# Patient Record
Sex: Female | Born: 1985 | Race: White | Hispanic: No | Marital: Married | Smoking: Never smoker
Health system: Southern US, Community
[De-identification: ages and names within clinical notes are randomized; demographics above are authoritative.]

## PROBLEM LIST (undated history)

## (undated) ENCOUNTER — Inpatient Hospital Stay (HOSPITAL_COMMUNITY): Payer: Self-pay

## (undated) DIAGNOSIS — IMO0002 Reserved for concepts with insufficient information to code with codable children: Secondary | ICD-10-CM

## (undated) DIAGNOSIS — L409 Psoriasis, unspecified: Secondary | ICD-10-CM

## (undated) DIAGNOSIS — D649 Anemia, unspecified: Secondary | ICD-10-CM

## (undated) DIAGNOSIS — F419 Anxiety disorder, unspecified: Secondary | ICD-10-CM

## (undated) DIAGNOSIS — J45909 Unspecified asthma, uncomplicated: Secondary | ICD-10-CM

## (undated) DIAGNOSIS — N12 Tubulo-interstitial nephritis, not specified as acute or chronic: Secondary | ICD-10-CM

## (undated) DIAGNOSIS — T7840XA Allergy, unspecified, initial encounter: Secondary | ICD-10-CM

## (undated) HISTORY — DX: Reserved for concepts with insufficient information to code with codable children: IMO0002

## (undated) HISTORY — PX: EYE SURGERY: SHX253

## (undated) HISTORY — DX: Allergy, unspecified, initial encounter: T78.40XA

## (undated) HISTORY — DX: Anxiety disorder, unspecified: F41.9

## (undated) HISTORY — DX: Psoriasis, unspecified: L40.9

---
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
## 2005-10-01 ENCOUNTER — Other Ambulatory Visit: Admission: RE | Admit: 2005-10-01 | Discharge: 2005-10-01 | Payer: Self-pay | Admitting: Family Medicine

## 2007-05-11 ENCOUNTER — Other Ambulatory Visit: Admission: RE | Admit: 2007-05-11 | Discharge: 2007-05-11 | Payer: Self-pay | Admitting: Family Medicine

## 2008-10-11 ENCOUNTER — Other Ambulatory Visit: Admission: RE | Admit: 2008-10-11 | Discharge: 2008-10-11 | Payer: Self-pay | Admitting: Family Medicine

## 2010-07-26 ENCOUNTER — Other Ambulatory Visit (HOSPITAL_COMMUNITY)
Admission: RE | Admit: 2010-07-26 | Discharge: 2010-07-26 | Disposition: A | Discharge status: Home / Self Care | Payer: Self-pay | Source: Ambulatory Visit | Attending: Family Medicine | Admitting: Family Medicine

## 2010-07-26 ENCOUNTER — Other Ambulatory Visit: Payer: Self-pay | Admitting: Family Medicine

## 2010-07-26 DIAGNOSIS — Z124 Encounter for screening for malignant neoplasm of cervix: Secondary | ICD-10-CM | POA: Insufficient documentation

## 2012-05-26 NOTE — L&D Delivery Note (Signed)
Cesarean Section Procedure Note  Indications: Low lying placenta, vaginal bleeding  Pre-operative Diagnosis: 36 week 2 day pregnancy.  Post-operative Diagnosis: same  Surgeon: Thurnell Lose   Assistants: Dr. Christophe Louis  Anesthesia: Spinal anesthesia  ASA Class: per anesthesia   Procedure Details   The patient was seen in the Holding Room. The risks, benefits, complications, treatment options, and expected outcomes were discussed with the patient.  The patient concurred with the proposed plan, giving informed consent.  The site of surgery properly noted/marked. The patient was taken to Operating Room # 2, identified as Natalie Barker and the procedure verified as C-Section Delivery. A Time Out was held and the above information confirmed.  After induction of anesthesia, the patient was draped and prepped in the usual sterile manner. A Pfannenstiel incision was made and carried down through the subcutaneous tissue to the fascia. Fascial incision was made and extended transversely. The fascia was separated from the underlying rectus tissue superiorly and inferiorly. The peritoneum was identified and entered. Peritoneal incision was extended longitudinally. The utero-vesical peritoneal reflection was incised transversely and the bladder flap was bluntly freed from the lower uterine segment. A low transverse uterine incision was made. Anterior placenta noted after transverse incision made.  Delivered from cephalic presentation was a pending gram Female with Apgar scores of 9 at one minute and 9 at five minutes. After the umbilical cord was clamped and cut cord blood was obtained for evaluation. The placenta was removed intact and appeared normal. The uterine outline, tubes and ovaries appeared normal. The uterine incision was closed with running locked sutures of 0.0  Chromic. Second layer was used for imbrication. Hemostasis was observed. Lavage was carried out until clear. The fascia was  then reapproximated with running sutures of 0.0 Vicryl. Subcutaneous space was reapproximated with 2.0 Plain gut.The skin was reapproximated with Vicryl 4.0 on a Keith needle.  Instrument, sponge, and needle counts were correct prior the abdominal closure and at the conclusion of the case.   Findings: Viable female infant, vertex.  Anterior placenta.  Clear fluid.  No adherance of placenta.  1 cm solid lesion in right mesosalpinx proximal to fibriae.  Estimated Blood Loss:  1000         Drains: Foley         Total IV Fluids:  Per anesthesia ml         Specimens: Placenta          Implants: Interceed         Complications:  None; patient tolerated the procedure well.         Disposition: PACU - hemodynamically stable.         Condition: stable  Attending Attestation: I was present and scrubbed for the entire procedure.

## 2012-08-31 ENCOUNTER — Other Ambulatory Visit (HOSPITAL_COMMUNITY)
Admission: RE | Admit: 2012-08-31 | Discharge: 2012-08-31 | Disposition: A | Discharge status: Home / Self Care | Payer: BC Managed Care – PPO | Source: Ambulatory Visit | Attending: Obstetrics and Gynecology | Admitting: Obstetrics and Gynecology

## 2012-08-31 ENCOUNTER — Other Ambulatory Visit: Payer: Self-pay | Admitting: Obstetrics and Gynecology

## 2012-08-31 DIAGNOSIS — Z01419 Encounter for gynecological examination (general) (routine) without abnormal findings: Secondary | ICD-10-CM | POA: Insufficient documentation

## 2013-01-12 ENCOUNTER — Other Ambulatory Visit: Payer: Self-pay | Admitting: Obstetrics and Gynecology

## 2013-01-12 DIAGNOSIS — O368190 Decreased fetal movements, unspecified trimester, not applicable or unspecified: Secondary | ICD-10-CM

## 2013-01-13 ENCOUNTER — Ambulatory Visit (HOSPITAL_COMMUNITY): Payer: BC Managed Care – PPO

## 2013-01-21 ENCOUNTER — Inpatient Hospital Stay (HOSPITAL_COMMUNITY): Payer: BC Managed Care – PPO

## 2013-01-21 ENCOUNTER — Inpatient Hospital Stay (HOSPITAL_COMMUNITY)
Admission: AD | Admit: 2013-01-21 | Discharge: 2013-01-21 | Disposition: A | Discharge status: Home / Self Care | Payer: BC Managed Care – PPO | Source: Ambulatory Visit | Attending: Obstetrics and Gynecology | Admitting: Obstetrics and Gynecology

## 2013-01-21 ENCOUNTER — Encounter (HOSPITAL_COMMUNITY): Payer: Self-pay | Admitting: *Deleted

## 2013-01-21 DIAGNOSIS — O26859 Spotting complicating pregnancy, unspecified trimester: Secondary | ICD-10-CM | POA: Insufficient documentation

## 2013-01-21 DIAGNOSIS — Z91013 Allergy to seafood: Secondary | ICD-10-CM

## 2013-01-21 DIAGNOSIS — O4700 False labor before 37 completed weeks of gestation, unspecified trimester: Secondary | ICD-10-CM | POA: Insufficient documentation

## 2013-01-21 DIAGNOSIS — Z880 Allergy status to penicillin: Secondary | ICD-10-CM

## 2013-01-21 DIAGNOSIS — O4440 Low lying placenta NOS or without hemorrhage, unspecified trimester: Secondary | ICD-10-CM

## 2013-01-21 HISTORY — DX: Tubulo-interstitial nephritis, not specified as acute or chronic: N12

## 2013-01-21 HISTORY — DX: Unspecified asthma, uncomplicated: J45.909

## 2013-01-21 HISTORY — DX: Anemia, unspecified: D64.9

## 2013-01-21 LAB — URINE MICROSCOPIC-ADD ON

## 2013-01-21 LAB — URINALYSIS, ROUTINE W REFLEX MICROSCOPIC
Glucose, UA: NEGATIVE mg/dL
Ketones, ur: NEGATIVE mg/dL
Leukocytes, UA: NEGATIVE
Protein, ur: NEGATIVE mg/dL

## 2013-01-21 LAB — CBC
Hemoglobin: 10.6 g/dL — ABNORMAL LOW (ref 12.0–15.0)
MCH: 28.3 pg (ref 26.0–34.0)
MCHC: 33.5 g/dL (ref 30.0–36.0)

## 2013-01-21 MED ORDER — NIFEDIPINE 10 MG PO CAPS: 10.0000 mg | ORAL_CAPSULE | Freq: Four times a day (QID) | ORAL | Status: DC | PRN

## 2013-01-21 MED ORDER — NIFEDIPINE 10 MG PO CAPS
10.0000 mg | ORAL_CAPSULE | Freq: Four times a day (QID) | ORAL | Status: DC
  Administered 2013-01-21: 10 mg via ORAL
  Filled 2013-01-21: qty 1

## 2013-01-21 NOTE — MAU Note (Addendum)
Started bleeding this afternoon.  Small amt of pani rt mid/lat back and RLQ,  Started before bleeding. Known previa.  Scant amt of blood when used restroom now.  Complete previa, has never had bleeding with preg, this is first.

## 2013-01-21 NOTE — MAU Provider Note (Signed)
Returned from [name].  No bleeding, reports crampy pain on right side that has been present all afternoon.  Denies N/V, has had no further bleeding.  [name] results:   Cervix 4.1 cm, posterior placenta, low-lying, 0.5 cm from os. EFW 5+1, 80%ile, AFI 17.92, 66%ile. Vtx.  Results for orders placed during the hospital encounter of 01/21/13 (from the past 24 hour(s))  URINALYSIS, ROUTINE W REFLEX MICROSCOPIC     Status: Abnormal   Collection Time    01/21/13  6:00 PM      Result Value Range   Color, Urine YELLOW  YELLOW   APPearance CLEAR  CLEAR   Specific Gravity, Urine 1.025  1.005 - 1.030   pH 6.0  5.0 - 8.0   Glucose, UA NEGATIVE  NEGATIVE mg/dL   Hgb urine dipstick MODERATE (*) NEGATIVE   Bilirubin Urine NEGATIVE  NEGATIVE   Ketones, ur NEGATIVE  NEGATIVE mg/dL   Protein, ur NEGATIVE  NEGATIVE mg/dL   Urobilinogen, UA 0.2  0.0 - 1.0 mg/dL   Nitrite NEGATIVE  NEGATIVE   Leukocytes, UA NEGATIVE  NEGATIVE  URINE MICROSCOPIC-ADD ON     Status: Abnormal   Collection Time    01/21/13  6:00 PM      Result Value Range   Squamous Epithelial / LPF FEW (*) RARE   WBC, UA 0-2  <3 WBC/hpf   RBC / HPF 3-6  <3 RBC/hpf   Bacteria, UA FEW (*) RARE   Urine-Other MUCOUS PRESENT    CBC     Status: Abnormal   Collection Time    01/21/13  6:40 PM      Result Value Range   WBC 15.4 (*) 4.0 - 10.5 K/uL   RBC 3.74 (*) 3.87 - 5.11 MIL/uL   Hemoglobin 10.6 (*) 12.0 - 15.0 g/dL   HCT 31.6 (*) 36.0 - 46.0 %   MCV 84.5  78.0 - 100.0 fL   MCH 28.3  26.0 - 34.0 pg   MCHC 33.5  30.0 - 36.0 g/dL   RDW 13.5  11.5 - 15.5 %   Platelets 252  150 - 400 K/uL  TYPE AND SCREEN     Status: None   Collection Time    01/21/13  6:40 PM      Result Value Range   ABO/RH(D) O POS     Antibody Screen NEG     Sample Expiration 01/24/2013     No bleeding.  FHR Category 1 UCs--irritability noted at times.  Plan: Consulted with Dr. Charlesetta Garibaldi. Will give Procardia 10 mg po now, and anticipate d/c'ing home on 10 mg po q  6 hours prn. Will defer betamethasone due to resolution of previa. Will d/c home on BR for the weekend and pelvic rest.  Donnel Saxon, CNM 01/21/13 8:30p  Addendum: Less irritability now. FHR Category 1 VSS.  D/C'd home with PTL precautions. F/U with Dr. Landry Mellow as scheduled on Tuesday or prn.  Donnel Saxon, CNM 01/21/13 11p

## 2013-01-21 NOTE — H&P (Signed)
Natalie Barker is a 27 y.o. female who receives prenatal with Dr. Thurnell Lose with [facility]  presenting  At 32 wks and 4 days based on 8 wk ultrasound  with EDD 03/15/2013. Pregnancy has been complicated by marginal previa on ultrasound 12/27/2012 in the office.  Patient reports dark brown spotting that occurred this afternoon. She noticed this with wiping. She denies sexual activity. No lof +FM no contractions.  History OB History   Grav Para Term Preterm Abortions TAB SAB Ect Mult Living   1              Past Medical History  Diagnosis Date  . Anemia   . Asthma   . Pyelonephritis     in grade school   Past Surgical History  Procedure Laterality Date  . Eye surgery      contacts surgically implanted   Family History: family history includes ALS in her maternal uncle; Asthma in her mother; COPD in her maternal grandmother; Cancer in her mother; Diabetes in her maternal grandmother; Heart disease in her father and maternal grandmother; Hypertension in her father and maternal grandmother; Stroke in her maternal grandmother. Social History:  reports that she has never smoked. She has never used smokeless tobacco. She reports that she does not drink alcohol or use illicit drugs.   Prenatal Transfer Tool  Maternal Diabetes: No Genetic Screening: Declined Maternal Ultrasounds/Referrals: Marginal previa on 12/27/2012 Fetal Ultrasounds or other Referrals:  None Maternal Substance Abuse:  No Significant Maternal Medications:  None Significant Maternal Lab Results:  None Other Comments:  None  Review of Systems  All other systems reviewed and are negative.      Blood pressure 124/68, pulse 98, temperature 98.3 F (36.8 C), temperature source Oral, resp. rate 18, height 5' 9"  (1.753 m), weight 89.812 kg (198 lb). Maternal Exam:  Introitus: Normal vulva. Vulva is negative for lesion.    Fetal Exam Fetal Monitor Review: Mode: fetoscope.   Baseline rate: 140.  Variability:  moderate (6-25 bpm).   Pattern: accelerations present.    Fetal State Assessment: Category I - tracings are normal.     Physical Exam  Constitutional: She is oriented to person, place, and time. She appears well-developed and well-nourished.  Respiratory: Effort normal.  GI: Soft. There is no tenderness.  Genitourinary: Vulva exhibits no lesion.  No blood on perineum  ..vaginal exam deferred due to history of previa   Musculoskeletal: Normal range of motion. She exhibits no edema.  Neurological: She is alert and oriented to person, place, and time.  Skin: Skin is warm and dry.    Prenatal labs: ABO, Rh:   O positive  Antibody:  Negative  Rubella:  Immune  RPR:   Non reactive  HBsAg:  Negative   HIV:   Non reactive  GBS:   unknown  Assessment/Plan: 32 wks and 4 days with h/O marginal previa with spontaneous spotting earlier. No active bleeding currently  Will order u/s for evaluation of placenta .Marland Kitchen If continued previa admit for betamethasone and bed rest. If no further bleeding after steroids d/c home on bedrest.  If no previa on ultrasound pt may be discharged home with follow up in the office   plan of care discussed with patient and her husband.  She is informed of [employer] coverage this evening and over the weekend .  Will discuss plan of care with  Dr. Charlesetta Garibaldi at 7pm this evening    Katya Rolston J. 01/21/2013, 6:39 PM

## 2013-01-25 ENCOUNTER — Other Ambulatory Visit: Payer: Self-pay | Admitting: Obstetrics and Gynecology

## 2013-01-25 ENCOUNTER — Inpatient Hospital Stay (HOSPITAL_COMMUNITY)
Admission: AD | Admit: 2013-01-25 | Discharge: 2013-01-25 | Disposition: A | Discharge status: Home / Self Care | Payer: BC Managed Care – PPO | Source: Ambulatory Visit | Attending: Obstetrics and Gynecology | Admitting: Obstetrics and Gynecology

## 2013-01-25 DIAGNOSIS — O4700 False labor before 37 completed weeks of gestation, unspecified trimester: Secondary | ICD-10-CM | POA: Insufficient documentation

## 2013-01-25 MED ORDER — BETAMETHASONE SOD PHOS & ACET 6 (3-3) MG/ML IJ SUSP
12.0000 mg | INTRAMUSCULAR | Status: DC
Start: 2013-01-25 — End: 2013-01-25
  Administered 2013-01-25: 12 mg via INTRAMUSCULAR
  Filled 2013-01-25: qty 2

## 2013-01-26 ENCOUNTER — Inpatient Hospital Stay (HOSPITAL_COMMUNITY)
Admission: AD | Admit: 2013-01-26 | Discharge: 2013-01-26 | Disposition: A | Discharge status: Home / Self Care | Payer: BC Managed Care – PPO | Source: Ambulatory Visit | Attending: Obstetrics and Gynecology | Admitting: Obstetrics and Gynecology

## 2013-01-26 DIAGNOSIS — O4700 False labor before 37 completed weeks of gestation, unspecified trimester: Secondary | ICD-10-CM | POA: Insufficient documentation

## 2013-01-26 MED ORDER — BETAMETHASONE SOD PHOS & ACET 6 (3-3) MG/ML IJ SUSP
12.0000 mg | Freq: Once | INTRAMUSCULAR | Status: AC
  Administered 2013-01-26: 12 mg via INTRAMUSCULAR
  Filled 2013-01-26: qty 2

## 2013-01-26 NOTE — MAU Note (Signed)
Here for 2nd Betamethasone injection

## 2013-02-03 ENCOUNTER — Encounter (HOSPITAL_COMMUNITY): Payer: Self-pay | Admitting: *Deleted

## 2013-02-03 ENCOUNTER — Inpatient Hospital Stay (HOSPITAL_COMMUNITY)
Admission: AD | Admit: 2013-02-03 | Discharge: 2013-02-19 | DRG: 650 | Disposition: A | Discharge status: Home / Self Care | Payer: BC Managed Care – PPO | Source: Ambulatory Visit | Attending: Obstetrics and Gynecology | Admitting: Obstetrics and Gynecology

## 2013-02-03 DIAGNOSIS — O4410 Placenta previa with hemorrhage, unspecified trimester: Principal | ICD-10-CM | POA: Diagnosis present

## 2013-02-03 DIAGNOSIS — D649 Anemia, unspecified: Secondary | ICD-10-CM | POA: Diagnosis present

## 2013-02-03 DIAGNOSIS — O9902 Anemia complicating childbirth: Secondary | ICD-10-CM | POA: Diagnosis present

## 2013-02-03 LAB — URINALYSIS, ROUTINE W REFLEX MICROSCOPIC
Bilirubin Urine: NEGATIVE
Hgb urine dipstick: NEGATIVE
Ketones, ur: NEGATIVE mg/dL
Protein, ur: NEGATIVE mg/dL
Urobilinogen, UA: 0.2 mg/dL (ref 0.0–1.0)

## 2013-02-03 LAB — CBC
MCH: 28.1 pg (ref 26.0–34.0)
MCHC: 33.5 g/dL (ref 30.0–36.0)
Platelets: 261 10*3/uL (ref 150–400)
RDW: 13.6 % (ref 11.5–15.5)

## 2013-02-03 LAB — TYPE AND SCREEN: ABO/RH(D): O POS

## 2013-02-03 MED ORDER — LACTATED RINGERS IV SOLN
INTRAVENOUS | Status: DC
  Administered 2013-02-03: via INTRAVENOUS

## 2013-02-03 MED ORDER — DOCUSATE SODIUM 100 MG PO CAPS
100.0000 mg | ORAL_CAPSULE | Freq: Every day | ORAL | Status: DC
  Administered 2013-02-04 – 2013-02-19 (×14): 100 mg via ORAL
  Filled 2013-02-03 (×16): qty 1

## 2013-02-03 MED ORDER — CALCIUM CARBONATE ANTACID 500 MG PO CHEW: 2.0000 | CHEWABLE_TABLET | ORAL | Status: DC | PRN

## 2013-02-03 MED ORDER — PRENATAL MULTIVITAMIN CH
1.0000 | ORAL_TABLET | Freq: Every day | ORAL | Status: DC
  Administered 2013-02-04 – 2013-02-19 (×16): 1 via ORAL
  Filled 2013-02-03 (×16): qty 1

## 2013-02-03 MED ORDER — LACTATED RINGERS IV BOLUS (SEPSIS)
500.0000 mL | Freq: Once | INTRAVENOUS | Status: AC
  Administered 2013-02-03: 20:00:00 via INTRAVENOUS

## 2013-02-03 MED ORDER — ACETAMINOPHEN 325 MG PO TABS
650.0000 mg | ORAL_TABLET | ORAL | Status: DC | PRN
  Administered 2013-02-05 – 2013-02-11 (×2): 650 mg via ORAL
  Administered 2013-02-11: 325 mg via ORAL
  Filled 2013-02-03 (×2): qty 2
  Filled 2013-02-03: qty 1

## 2013-02-03 MED ORDER — ZOLPIDEM TARTRATE 5 MG PO TABS
5.0000 mg | ORAL_TABLET | Freq: Every evening | ORAL | Status: DC | PRN
  Administered 2013-02-03 – 2013-02-13 (×8): 5 mg via ORAL
  Filled 2013-02-03 (×8): qty 1

## 2013-02-03 NOTE — H&P (Addendum)
Natalie Barker is a 27 y.o. female G1 at 69 2/7 weeks consistent with an 8 weeks ultrasound with a marginal previa presenting for c/o vaginal bleeding.  Pt was sitting at her desk when she felt the urge to urinate then felt something pass.  When she went to the bathroom, she saw it a dime sized clot when she wiped.  She saw minimal blood as she continued wiping.  Currently she has no blood on her pantiliner.  Pt was seen yesterday in the office for a routine ob visit.  Due to c/o of vaginal discharge/leakiing, lower 1/3 of vagina was examined to collect discharge. Pt was not clinically ruptured and no ferning seen.  Pt with c/o mild intermittent cramping.  Pt has a h/o scant brown spotting at 32 4/7 weeks.  She was evaluated in MAU and was noted to have mild irregular contractions which resolved with Procardia.  A preliminary report showed she had a low lying placenta so s she was discharged.  The final report stated she had a marginal previa, 0.5 cm from the os, which was read by the MFM.  Pt was given a course of BMZ last week at 33 weeks due to this finding.  Pt has not had bleeding since this time until today.  PNC has been o/w uncomplicated.  Complete placenta previa diagnosed at 20 weeks at time of anatomy ultrasound.  Pt also has a hemorrhoid but denies rectal bleeding.  Maternal Medical History:  Reason for admission: Vaginal bleeding.   Contractions: Onset was 1-2 hours ago.    Fetal activity: Perceived fetal activity is normal.    Prenatal complications: Placental abnormality.   Prenatal Complications - Diabetes: none.    OB History   Grav Para Term Preterm Abortions TAB SAB Ect Mult Living   1              Past Medical History  Diagnosis Date  . Anemia   . Asthma   . Pyelonephritis     in grade school   Past Surgical History  Procedure Laterality Date  . Eye surgery      contacts surgically implanted   Family History: family history includes ALS in her maternal  uncle; Asthma in her mother; COPD in her maternal grandmother; Cancer in her mother; Diabetes in her maternal grandmother; Heart disease in her father and maternal grandmother; Hypertension in her father and maternal grandmother; Stroke in her maternal grandmother. Social History:  reports that she has never smoked. She has never used smokeless tobacco. She reports that she does not drink alcohol or use illicit drugs.   Review of Systems  Constitutional: Negative for fever and chills.  Respiratory: Positive for shortness of breath.   Gastrointestinal: Positive for vomiting and abdominal pain.  Neurological: Negative for headaches.      Blood pressure 129/85, pulse 107, temperature 98.4 F (36.9 C), temperature source Oral, resp. rate 16, weight 88.814 kg (195 lb 12.8 oz), SpO2 100.00%. Maternal Exam:  Uterine Assessment: Contraction frequency is irregular.   Abdomen: Fetal presentation: vertex  Introitus: Normal vulva. Vulva is negative for condylomata and lesion.  No bleeding seen at introitus.  Q tip for Amnisure was not blood stained.  Cervix: not evaluated.   Fetal Exam Fetal Monitor Review: Mode: fetoscope.   Baseline rate: Reactive, no decels.  Variability: moderate (6-25 bpm).   Pattern: no decelerations.    Fetal State Assessment: Category I - tracings are normal.     Physical Exam  Constitutional: She is oriented to person, place, and time. She appears well-developed. No distress.  HENT:  Head: Normocephalic and atraumatic.  Eyes: EOM are normal.  Neck: Normal range of motion.  Cardiovascular: Normal rate, regular rhythm and normal heart sounds.   Respiratory: Effort normal and breath sounds normal.  GI: There is no tenderness.  Genitourinary: Vulva exhibits no lesion.  Musculoskeletal: She exhibits edema.  Trace edema.  Neurological: She is alert and oriented to person, place, and time.  Skin: Skin is warm and dry. She is not diaphoretic.  Psychiatric: She  has a normal mood and affect.    Prenatal labs: ABO, Rh: --/--/O POS, O POS (08/29 1840) Antibody: NEG (08/29 1840) Rubella:   RPR:    HBsAg:    HIV:    GBS:     Assessment/Plan: IUP at 34 2/7 weeks, Marginal previa. S/p BMZ.   Last ultrasound @ 32 3/7 weeks. Marginal previa. Presumed vaginal bleeding.  Bleeding origin is vaginal until proven otherwise.  R/o UTI with UA and culture. Pt has only been on monitor x 30 minutes but regular contractions have not been seen. + Irritability.  IVF bolus of LR then continue 125 ml/hr. Amnisure to confirm intact membranes. GBS collected with sensitivities in the event of ROM or labor. Continuous monitoring. NPO x 4 hours in the event bleeding resumes.   MFM consult in am to assess placental location and provide recommendations.   Unsure if this would be considered the sentinel or second bleed due to the scant amount of brown discharge previously.  If this is considered the second bleed, pt counseled it may be advised she continue inpatient bedrest. If ROM detected with Amnisure and marginal previa still present, pt counseled she c-section is recommended b/c it is not recommended to continue the pregnancy past 34 weeks with ROM.  R/B of c-section d/w pt and family at bedside. All questions answered. Pt informed that Dr. Raphael Gibney is the on-call MD.  Verbal check out done prior to the return of labs or substantial  monitoring   Bronwyn Belasco 02/03/2013, 7:38 PM

## 2013-02-03 NOTE — MAU Note (Signed)
Patient states she has a marginal previa. States she had spotting a couple of days ago, today was heavier and brown/red with a dime size blood clot. Having abdominal pressure. Has had fetal movement but not as much as usual today.

## 2013-02-04 ENCOUNTER — Inpatient Hospital Stay (HOSPITAL_COMMUNITY): Payer: BC Managed Care – PPO

## 2013-02-04 ENCOUNTER — Ambulatory Visit (HOSPITAL_COMMUNITY): Payer: BC Managed Care – PPO

## 2013-02-04 LAB — URINALYSIS, ROUTINE W REFLEX MICROSCOPIC
Bilirubin Urine: NEGATIVE
Leukocytes, UA: NEGATIVE
Nitrite: NEGATIVE
Specific Gravity, Urine: 1.015 (ref 1.005–1.030)
Urobilinogen, UA: 0.2 mg/dL (ref 0.0–1.0)

## 2013-02-04 LAB — URINE CULTURE: Colony Count: 7000

## 2013-02-04 LAB — URINE MICROSCOPIC-ADD ON

## 2013-02-04 MED ORDER — SODIUM CHLORIDE 0.9 % IJ SOLN
3.0000 mL | Freq: Two times a day (BID) | INTRAMUSCULAR | Status: DC
  Administered 2013-02-04 – 2013-02-08 (×6): 3 mL via INTRAVENOUS

## 2013-02-04 NOTE — Progress Notes (Signed)
MATERNAL FETAL MEDICINE CONSULT  Patient Name: Natalie Barker Medical Record Number:  [id number] Date of Birth: 1986-02-08 Requesting Physician Name:  Thurnell Lose, MD Date of Service: 02/04/2013  Chief Complaint Low-lying placenta with vaginal bleeding  History of Present Illness Keashia Haskins was seen secondary to vaginal bleeding in the setting of a known low-lying placenta at the request of Thurnell Lose, MD.  The patient is a 27 y.o. G1P0,at 62w3dwith an EDD of 03/15/2013, Alternate EDD Entry dating method.  She had some brown spotting several weeks ago that prompted an MAU evaluation.  She had an ultrasound at that demonstrated a low-lying placenta/marginal placenta previa.  The spotting resolved and she was dismissed home.  Yesterday she experienced an episode of vaginal bleeding that prompted the present admission.  [facility] 0.5 cm from the internal cervical os.  She reports her bleeding has not recurred.  Review of Systems Pertinent items are noted in HPI.  Patient History OB History  Gravida Para Term Preterm AB SAB TAB Ectopic Multiple Living  1             # Outcome Date GA Lbr Len/2nd Weight Sex Delivery Anes PTL Lv  1 CUR               Past Medical History  Diagnosis Date  . Anemia   . Asthma   . Pyelonephritis     in grade school    Past Surgical History  Procedure Laterality Date  . Eye surgery      contacts surgically implanted    History   Social History  . Marital Status: Married    Spouse Name: N/A    Number of Children: N/A  . Years of Education: N/A   Social History Main Topics  . Smoking status: Never Smoker   . Smokeless tobacco: Never Used  . Alcohol Use: No  . Drug Use: No  . Sexual Activity: Not Currently   Other Topics Concern  . None   Social History Narrative  . None    Family History  Problem Relation Age of Onset  . Cancer Mother      thyroid, colon, breast  . Asthma Mother   . Hypertension Father   . Heart disease Father     heart attack, pacemaker  . Diabetes Maternal Grandmother   . Heart disease Maternal Grandmother   . COPD Maternal Grandmother   . Stroke Maternal Grandmother   . Hypertension Maternal Grandmother   . ALS Maternal Uncle    In addition, the patient has no family history of mental retardation, birth defects, or genetic diseases.  Physical Examination Filed Vitals:   02/04/13 0800  BP: 97/51  Pulse: 82  Temp: 98.9 F (37.2 C)  Resp:    General appearance - alert, well appearing, and in no distress  Assessment and Recommendations 1.  Low-lying placenta.  It is very likely that the bleeding Ms. CBuleyexperienced was related to the low-lying placenta.  Given the vague history of brown spotting she reported prior to this episode, I would consider this her first bleeding event.  I would continue inpatient observation through the weekend, but if she has not further bleeding she can be dismissed and followed as an outpatient.  The placenta is to close to the cervix to safely allow for a vaginal delivery at this time.  Although further placental migration away from the os  is unlikely, I recommend performing an additional transvaginal ultrasound in 2 weeks.  If the placenta is 2 or more cm away from the cervix pregnancy can be allowed to continue and a vaginal delivery attempted.  If the placenta is less than 2 cm from the os Ms. Ports should be delivered via LTCS between 36-37 weeks.  I spent 15 minutes with Ms. Thoennes today of which 50% was face-to-face counseling.  Thank you for referring Ms. Allis to the [facility].  Please do not hesitate to contact us with questions.   Jolyn Lent, MD

## 2013-02-04 NOTE — Progress Notes (Signed)
Patient ID: Natalie Barker, female   DOB: 06-05-1985, 27 y.o.   MRN: [id number]  Pt without complaints.  No bleeding overnight.  S/p MFM consult this am. AFVSS Gen:  NAD, comfortable. Abdomen:  No fundal tenderness Ext:  No edema, nontenderness.  Ultrasound per MFM- Low-lying placenta, 0.5 cm from the os. Reviewed consult note and spoke with Dr. Nelda Severe. Hg 10.9  A/P:  IUP at 34 3/7 weeks, Low-lying placenta, s/p BMZ with vaginal bleeding.  Currently not bleeding. Appreciate MFM consult. Observe for the next 72 hours.  Bleeding yesterday will be pt's first episode of bleeding due to the vague brown spotting at 32 weeks. If no bleeding, discharge home on bedrest. Repeat ultrasound in 2 weeks to assess placental location.  If 2 cm away from os, pt candidate for a vaginal delivery.  If not, proceed with cesarean section at 37 weeks or if bleeding. Plan d/w pt and husband.  All questions answered.

## 2013-02-05 LAB — CBC
HCT: 33.6 % — ABNORMAL LOW (ref 36.0–46.0)
Hemoglobin: 10.9 g/dL — ABNORMAL LOW (ref 12.0–15.0)
MCH: 27.7 pg (ref 26.0–34.0)
MCHC: 32.4 g/dL (ref 30.0–36.0)
RDW: 13.7 % (ref 11.5–15.5)

## 2013-02-05 MED ORDER — NIFEDIPINE 10 MG PO CAPS
10.0000 mg | ORAL_CAPSULE | Freq: Four times a day (QID) | ORAL | Status: DC
  Administered 2013-02-05 – 2013-02-06 (×3): 10 mg via ORAL
  Filled 2013-02-05 (×3): qty 1

## 2013-02-05 MED ORDER — NIFEDIPINE 10 MG PO CAPS
10.0000 mg | ORAL_CAPSULE | Freq: Once | ORAL | Status: AC
  Administered 2013-02-05: 10 mg via ORAL

## 2013-02-05 MED ORDER — NIFEDIPINE 10 MG PO CAPS
ORAL_CAPSULE | ORAL | Status: AC
  Administered 2013-02-05: 10 mg via ORAL
  Filled 2013-02-05: qty 1

## 2013-02-05 MED ORDER — LACTATED RINGERS IV SOLN
INTRAVENOUS | Status: DC
  Administered 2013-02-05 – 2013-02-06 (×3): via INTRAVENOUS

## 2013-02-05 NOTE — Progress Notes (Signed)
No bleeding noted

## 2013-02-05 NOTE — Progress Notes (Signed)
Small amount of yellow mucous noted on pad. No bleeding

## 2013-02-05 NOTE — Progress Notes (Signed)
Pt reports two bright red clots while in shower

## 2013-02-05 NOTE — Progress Notes (Signed)
No bleeding  Noted on pad or perineum

## 2013-02-05 NOTE — Progress Notes (Signed)
No bleeding noted on pad or perineum

## 2013-02-06 LAB — TYPE AND SCREEN: ABO/RH(D): O POS

## 2013-02-06 LAB — CULTURE, BETA STREP (GROUP B ONLY)

## 2013-02-06 LAB — OB RESULTS CONSOLE GBS: GBS: NEGATIVE

## 2013-02-06 NOTE — Progress Notes (Signed)
Patient ID: Natalie Barker, female   DOB: 10-01-1985, 27 y.o.   MRN: [id number]  Late entry  Called by RN that pt passed 2 small clots in shower. Pt with cramping rated 3/10.  Contractions noted on monitor.  I was in surgery and unavailable to assess pt.  Per RN, pt was not acute.  Order given for IVF, NPO, Procardia, Stat CBC, continuous monitoring.  At time of assessment, pt stated she was comfortable and had no active bleeding. Active fetus.  Denied LOF.  Felt more comfortable s/p Procardia.  AFVSS  Gen:  NAD, Comfortable Abd:  No fundal tenderness Ext:  No calf tenderness  NST:  Reactive Contractions irregular, spaced out after Procardia.  Hg 10.9 on admission, now 10.9.  A/P:   IUP at 34 4/7 weeks, Low-lying placenta, S/p BMZ. Intemittent vaginal bleeding. Pt denies significant bleeding but very small clots.  Fetal monitoring Cat 1. Continue plan for now.  Do not recommend tocolysis long term. If bleeding increases or fetal distress, proceed with primary LTCS. Plan d/w pt at length. Dr. Charlesetta Garibaldi to cover call until 3pm.  Will update her on pt.  Pt and husband aware.

## 2013-02-06 NOTE — Progress Notes (Signed)
Patient stated that she is feeling better about her plan of care and that Dr. [name] answered her questions. She states "I just wish I knew when I would deliver". Patient concerned about work, stating she only has 12 weeks of leave or her position will become available to someone else. She explained that this is a factor in her anxiety presently.

## 2013-02-06 NOTE — Progress Notes (Signed)
Lab addendum  GBS negative Urine culture Negative UTI.

## 2013-02-06 NOTE — Progress Notes (Signed)
HD #4  Subjective:  Pt without complaints.  Passed a small bright red clot in the shower this am.  Denies contractions or LOF.  Denies active bleeding.  Active fetal movement.  Objective: Vital signs in last 24 hours: Temp:  [98.5 F (36.9 C)-98.9 F (37.2 C)] 98.5 F (36.9 C) (09/14 1231) Pulse Rate:  [79-111] 97 (09/14 1231) Resp:  [16-20] 16 (09/14 1231) BP: (101-121)/(52-68) 121/57 mmHg (09/14 1231)  Physical Exam:  General: alert, cooperative and no distress Uterine Fundus: Nontender DVT Evaluation: No evidence of DVT seen on physical exam. TED hose on.  EFM:  140s, Reactive, no decels, occasional contraction.  Recent Labs  02/03/13 1935 02/05/13 1030  HGB 10.9* 10.9*  HCT 32.5* 33.6*  WBC 15.1 to 14.  Assessment/Plan: 34 5/7 weeks, low lying placenta.  S/p BMZ.  Admitted for vaginal bleeding.   Currently stable, no active vaginal bleeding.  Hg unchanged from admission.  Type and screen q 3 days. Procardia discontinued this am.  Saline lock IV. Clear liquids for 6 hours to observe if contractions resume and hence bleeding. Proceed with primary LTCS in the event of active bleeding, fetal distress or labor. Plan d/w pt, husband and father at bedside at length.  All questions answered. Plan d/w MFM Dr. Hoy Morn to determine endpoint.  Does not consider small clots, active bleeding and would proceed with cesarean section for the indications above.  Thurnell Lose 02/06/2013, 12:59 PM

## 2013-02-06 NOTE — Progress Notes (Signed)
Today, patient has been frustrated and anxious about her plan of care. Per patient she feels like "the plan of care has not been consistent, and it has changed frequently" I spent time with her to explain a premature delivery and  the nature being uncertain. I explained that the healthcare team is cautious when it comes to a premature delivery as we do not want to delivery until necessary.  She verbalized understanding.  After we had multiple conversations about this, I encouraged her to talk to Dr. [name] about her concerns. I encouraged her to write down a list of questions that she had and to let me know when she was ready.  At 1540, patient let me know she was ready with her list and I called Dr. [name]. I explained patient's frustrations to MD and then transferred the call into the patient's room so that she could discuss directly with patient.

## 2013-02-06 NOTE — Progress Notes (Signed)
Patient reported a small clot in the shower. 1 inch long, and thin. RN reported to Dr. [name]. MD gave order to discontinue Procardia and monitor closely for contractions and bleeding.

## 2013-02-07 MED ORDER — FERROUS GLUCONATE 324 (38 FE) MG PO TABS
324.0000 mg | ORAL_TABLET | Freq: Every day | ORAL | Status: DC
  Administered 2013-02-08 – 2013-02-15 (×8): 324 mg via ORAL
  Filled 2013-02-07 (×9): qty 1

## 2013-02-07 NOTE — Progress Notes (Signed)
Ur chart review completed.  

## 2013-02-07 NOTE — Progress Notes (Signed)
HD #5  Subjective:  Pt without complaints. Denies LOF, vaginal bleeding.  Active fetus.  Objective: Vital signs in last 24 hours: Temp:  [98.1 F (36.7 C)-99.1 F (37.3 C)] 99.1 F (37.3 C) (09/15 1233) Pulse Rate:  [88-103] 103 (09/15 1233) Resp:  [18] 18 (09/15 1233) BP: (96-114)/(44-69) 106/51 mmHg (09/15 1233)  Physical Exam:  General: alert, cooperative and no distress Uterine Fundus: Nontender DVT Evaluation: No evidence of DVT seen on physical exam. TED hose on.  EFM:  130s-140s, Reactive, no decels, occasional contraction.  Recent Labs  02/05/13 1030  HGB 10.9*  HCT 33.6*  WBC 15.1 to 14.  Assessment/Plan: 34 6/7 weeks, low lying placenta.  S/p BMZ.  Admitted for vaginal bleeding.   Currently stable, no active vaginal bleeding.  Fetal status reassuring, Cat 1.  NST x 1 hour TID. Add Fergon due to mild anemia of pregnancy and to optimize Hemoglobin for surgery. Proceed with primary LTCS in the event of active bleeding, fetal distress or labor. Continue inpatient bedrest. Ultrasound at 36 weeks to reevaluate placenta and assess growth.   Thurnell Lose 02/07/2013, 1:01 PM

## 2013-02-08 NOTE — Progress Notes (Signed)
Pt. Stated she wanted to wait until 12 or 1pm to do her fetal monitoring.

## 2013-02-08 NOTE — Progress Notes (Signed)
HD #6  Subjective:  Pt without complaints. Denies LOF, vaginal bleeding.  Active fetus.  Feels contractions but they are not painful.  Tolerating iron.  Objective: Vital signs in last 24 hours: Temp:  [98.3 F (36.8 C)-99.1 F (37.3 C)] 98.3 F (36.8 C) (09/16 1951) Pulse Rate:  [79-94] 86 (09/16 1951) Resp:  [18] 18 (09/16 1951) BP: (109-117)/(59-68) 117/64 mmHg (09/16 1951)  Physical Exam:  General: alert, cooperative and no distress Uterine Fundus: Nontender DVT Evaluation: No evidence of DVT seen on physical exam. TED hose off.  EFM:  150s, Reactive, no decels, occasional contraction. No results found for this basename: HGB, HCT,  in the last 72 hours  Assessment/Plan: 35 weeks, low lying placenta.  S/p BMZ.  Admitted for vaginal bleeding.   Currently stable. No active vaginal bleeding.  Fetal status reassuring, Cat 1.  NST x 1 hour TID. Anemia.  Fergon daily. Proceed with primary LTCS in the event of active bleeding, fetal distress or labor. Continue inpatient bedrest. Ultrasound at 36 weeks to reevaluate placenta and assess growth.   Natalie Barker 02/08/2013, 9:41 PM

## 2013-02-09 LAB — TYPE AND SCREEN

## 2013-02-09 MED ORDER — DIPHENHYDRAMINE-ZINC ACETATE 2-0.1 % EX CREA
TOPICAL_CREAM | Freq: Four times a day (QID) | CUTANEOUS | Status: DC | PRN
  Administered 2013-02-09: 20:00:00 via TOPICAL
  Administered 2013-02-09: 1 via TOPICAL
  Filled 2013-02-09: qty 28

## 2013-02-09 NOTE — Progress Notes (Signed)
HD #6  Subjective:  Pt without complaints. Denies vaginal bleeding.  Active fetus.  Feels contractions but they are not painful. White discharge that filled a pantiliner but has not continued to leak.  Pain when turning wrist.  Objective: Vital signs in last 24 hours: Temp:  [98.3 F (36.8 C)-99.1 F (37.3 C)] 98.4 F (36.9 C) (09/17 0757) Pulse Rate:  [71-94] 89 (09/17 1104) Resp:  [18-20] 18 (09/17 1104) BP: (102-117)/(58-68) 108/65 mmHg (09/17 1104)  Physical Exam:  General: alert, cooperative and no distress Uterine Fundus: Nontender DVT Evaluation: No evidence of DVT seen on physical exam. Wrist/arm not erythematous, TED hose off.  EFM:  10s, Reactive, no decels, cxt x 1  No results found for this basename: HGB, HCT,  in the last 72 hours  Assessment/Plan: 35 weeks, low lying placenta.  S/p BMZ.  Admitted for vaginal bleeding.   Currently stable. No active vaginal bleeding.  D/c IV. Fetal status reassuring, Cat 1.  NST x 1 hour TID. Anemia.  Fergon daily. Proceed with primary LTCS in the event of active bleeding, fetal distress or labor. Continue inpatient bedrest.  Pt lives 45 minutes away. Ultrasound at 36 weeks to reevaluate placenta and assess growth.   Natalie Barker 02/09/2013, 2:10 PM

## 2013-02-09 NOTE — Progress Notes (Signed)
02/09/13 1500  Clinical Encounter Type  Visited With Patient and family together (mom, close friend Natalie Barker)  Visit Type Initial;Spiritual support;Social support  Spiritual Encounters  Spiritual Needs Emotional   Joined ante staff in singing "Happy Birthday" to Natalie Barker and made introductory visit.  Her mom helped point out occasions when it might be especially nice to reach out for chaplain support, and Natalie Barker was receptive.  She reports good support and is feeling calm and happy as her pregnancy continues to progress.  Provided pastoral listening and encouragement.  Please page as further needs arise.  [place], [place]

## 2013-02-10 MED ORDER — DIPHENHYDRAMINE HCL 25 MG PO CAPS
25.0000 mg | ORAL_CAPSULE | ORAL | Status: DC | PRN
  Administered 2013-02-10: 25 mg via ORAL
  Filled 2013-02-10 (×3): qty 1

## 2013-02-10 NOTE — Progress Notes (Signed)
UR completed 

## 2013-02-10 NOTE — Progress Notes (Signed)
HD #7  Subjective:  Pt without complaints. Denies vaginal bleeding.  Active fetus.   Had itching from site of monitors on abdomen.  Slightly improved with topical benadryl.  Had vomiting this am.  Denies reflux symptoms.  Not sleeping overnight despite Ambien.  Objective: Vital signs in last 24 hours: Temp:  [98.1 F (36.7 C)-98.5 F (36.9 C)] 98.2 F (36.8 C) (09/18 1201) Pulse Rate:  [75-101] 86 (09/18 1201) Resp:  [18-20] 18 (09/18 1201) BP: (95-123)/(48-78) 122/69 mmHg (09/18 1201)  Physical Exam:  General: alert, cooperative and no distress Uterine Fundus: Nontender Abdomen:  Erythematous from fetascope.  No rash or lesions. DVT Evaluation: No evidence of DVT seen on physical exam. TED hose off.  EFM:  10s, Reactive, no decels, cxt x 1  No results found for this basename: HGB, HCT,  in the last 72 hours  Assessment/Plan: 35 2/7 weeks, low lying placenta (0.5 cm from os).  S/p BMZ.  Admitted for vaginal bleeding.   Currently stable. No active vaginal bleeding.  Fetal status reassuring, Cat 1.  NST x 1 hour TID. Anemia.  Fergon daily. Proceed with primary LTCS in the event of active bleeding, fetal distress or labor. Continue inpatient bedrest.  Pt lives 45 minutes away. Ultrasound at 36 weeks to reevaluate placenta and assess growth. Contact dermatitis.  Add benadryl po prn itching.  Thurnell Lose 02/10/2013, 1:43 PM

## 2013-02-10 NOTE — Progress Notes (Signed)
Antenatal Nutrition Assessment:  Currently  35 2/[redacted] weeks gestation, with low lying placenta. Height  69" Weight 192 lbs pre-pregnancy weight 180 lbs.Pre-pregnancy  BMI 26.6  IBW 145 lbs  Total weight gain 12 lbs. Weight gain goals 15-25 lbs.   Estimated needs: 19-2100 kcal/day, 80 grams protein/day, 2.3 liters fluid/day  Antenatal regular diet tolerated well, appetite good. Current diet prescription will provide for increased needs.  No abnormal nutrition related labs  Nutrition Dx: Increased nutrient needs r/t pregnancy and fetal growth requirements aeb [redacted] weeks gestation.  No educational needs assessed at this time.  Weyman Rodney M.Fredderick Severance LDN Neonatal Nutrition Support Specialist Pager [phone] ]

## 2013-02-11 MED ORDER — NIFEDIPINE 10 MG PO CAPS
10.0000 mg | ORAL_CAPSULE | Freq: Four times a day (QID) | ORAL | Status: DC | PRN
  Administered 2013-02-11 – 2013-02-12 (×2): 10 mg via ORAL
  Filled 2013-02-11 (×4): qty 1

## 2013-02-11 NOTE — Progress Notes (Signed)
HD #8    Subjective:  patient denies any current bleeding.. Last episode of bleeding was 9/16 she had 2 small quarter size clots noticed while in the shower. She has a contraction every 10-12 minutes  Objective: Vital signs in last 24 hours: Temp:  [97.9 F (36.6 C)-98.6 F (37 C)] 98.4 F (36.9 C) (09/19 0011) Pulse Rate:  [84-98] 86 (09/19 0011) Resp:  [16-18] 18 (09/19 0011) BP: (95-122)/(50-69) 104/51 mmHg (09/19 0011)    Intake/Output from previous day:   Intake/Output this shift:    General appearance: alert and cooperative GI: gravid nontender  Extremities: extremities normal, atraumatic, no cyanosis or edema  Lab Results:  No results found for this basename: WBC, HGB, HCT, PLT,  in the last 72 hours BMET No results found for this basename: NA, K, CL, CO2, GLUCOSE, BUN, CREATININE, CALCIUM,  in the last 72 hours PT/INR No results found for this basename: LABPROT, INR,  in the last 72 hours ABG No results found for this basename: PHART, PCO2, PO2, HCO3,  in the last 72 hours  Studies/Results: No results found.  Anti-infectives: Anti-infectives   None      Assessment/Plan: 35 3/7 weeks, low lying placenta (0.5 cm from os). S/p BMZ. Admitted for vaginal bleeding.  Currently stable. No active vaginal bleeding.  Fetal status reassuring, Cat 1. NST x 1 hour TID.  Anemia. Fergon daily.  Proceed with primary LTCS in the event of active bleeding, fetal distress or labor.  Continue inpatient bedrest. Pt lives 45 minutes away.  Ultrasound at 36 weeks to reevaluate placenta and assess growth.  Contact dermatitis. Add benadryl po prn itching.  pt may have a wheelchair ride daily as long as no bleeding  Preterm contractions procardia 10 mg   Ziara Thelander J. 02/11/2013

## 2013-02-12 LAB — PREPARE RBC (CROSSMATCH)

## 2013-02-12 MED ORDER — LACTATED RINGERS IV BOLUS (SEPSIS): 1000.0000 mL | Freq: Once | INTRAVENOUS | Status: DC

## 2013-02-12 MED ORDER — CYCLOBENZAPRINE HCL 10 MG PO TABS
10.0000 mg | ORAL_TABLET | Freq: Three times a day (TID) | ORAL | Status: DC | PRN
  Administered 2013-02-12: 10 mg via ORAL
  Filled 2013-02-12 (×2): qty 1

## 2013-02-12 NOTE — Progress Notes (Signed)
HD #9 third trimester bleeding. Low lying placenta/ preterm contractions  Subjective:  pt reports contractions q 8-10 minutes. Brown discharge no bright red or active bleeding.   Objective: Vital signs in last 24 hours: Temp:  [98.2 F (36.8 C)-98.8 F (37.1 C)] 98.8 F (37.1 C) (09/20 0801) Pulse Rate:  [84-103] 88 (09/20 0801) Resp:  [18-20] 20 (09/20 0801) BP: (111-119)/(58-66) 114/64 mmHg (09/20 0801)    Intake/Output from previous day:   Intake/Output this shift:    General appearance: alert and cooperative FHR NST from 02/11/2013 reactive  No contractions noted.   Lab Results:  No results found for this basename: WBC, HGB, HCT, PLT,  in the last 72 hours BMET No results found for this basename: NA, K, CL, CO2, GLUCOSE, BUN, CREATININE, CALCIUM,  in the last 72 hours PT/INR No results found for this basename: LABPROT, INR,  in the last 72 hours ABG No results found for this basename: PHART, PCO2, PO2, HCO3,  in the last 72 hours  Studies/Results: No results found.  Anti-infectives: Anti-infectives   None      Assessment/Plan: 35 4/7 weeks, low lying placenta (0.5 cm from os). S/p BMZ. Admitted for vaginal bleeding.  Currently stable. No active vaginal bleeding.  Fetal status reassuring, Cat 1. NST x 1 hour TID.  Anemia. Fergon daily.  Proceed with primary LTCS in the event of active bleeding, fetal distress or labor.  Continue inpatient bedrest. Pt lives 45 minutes away.  Ultrasound at 36 weeks to reevaluate placenta and assess growth.   pt may have a wheelchair ride daily as long as no bleeding  Preterm contractions procardia 10 mg  Plan to extend monitoring this am to monitor for contractions.   LOS: 9 days    Shirl Ludington J. 02/12/2013

## 2013-02-13 MED ORDER — SODIUM CHLORIDE 0.9 % IJ SOLN: 3.0000 mL | INTRAMUSCULAR | Status: DC | PRN

## 2013-02-13 MED ORDER — SODIUM CHLORIDE 0.9 % IJ SOLN
3.0000 mL | Freq: Two times a day (BID) | INTRAMUSCULAR | Status: DC
  Administered 2013-02-13 – 2013-02-16 (×7): 3 mL via INTRAVENOUS

## 2013-02-13 NOTE — Progress Notes (Signed)
Patient ID: Natalie Barker, female   DOB: [birth year], 27 y.o.   MRN: [id number] I came to see the pt and she went for a picnic with her husband.  Will continue to watch for bleeding.  NST reassuring.  Continue supportive care

## 2013-02-13 NOTE — Progress Notes (Addendum)
Hospital day # 10 pregnancy at [redacted]w[redacted]d-Marginal previa, with 3rd trimester bleeding.  S:  Sleeping at present--took Ambien early in the am.  Per RN report, no issues during the night.       Vaginal bleeding: None reported       O: BP 113/59  Pulse 86  Temp(Src) 98.1 F (36.7 C) (Oral)  Resp 18  Ht 5' 9"  (1.753 m)  Wt 192 lb (87.091 kg)  BMI 28.34 kg/m2  SpO2 100%      Fetal tracings:  Category 1 on q shift monitoring (last tracing 9-10pm)      Contractions:   4/hour on monitoring      TED hose use intermittently         Labs:  T&S q 72 hours, last one 9/20       Meds: Using Flexeril for discomfort--made her very sleepy yesterday, couldn't sleep last night until taking Ambien at       12:30am  A: 347w5dith marginal previa, 3rd trimester bleeding     Stable  P: Continue current plan of care      Upcoming tests/treatments:  USKorealanned at 36 weeks for recheck of placental locale                                                     Primary C/S scheduled 9/30.                                                             MDs will follow  LADonnel SaxonNM, [place] 02/13/2013 8:50 AM  Addendum: [facility]--patient had 2 spots of BRB, but no further or active bleeding.  Denied significant cramping. Currently on FM--sporadic mild UCs, Category 1 FHR. Consulted with Dr. DiGlennie Hawkontinue to observe at present.  ViDonnel SaxonCNM 02/13/13 11:50am

## 2013-02-14 NOTE — Progress Notes (Signed)
Perinatal educator, Natalie Barker, came per RN request to provide videos and booklets to the patient since she is unable to take birthing classes due to her stay in the hospital. Pt. Pleased to have material.

## 2013-02-14 NOTE — Progress Notes (Addendum)
HD #11  Subjective: Had bleeding yesterday.  Pt reports in ran down the inside of her thigh.  Had brown discharge yesterday and now yellow.  Was contracting at the time.   Feeling contractions today 5/10.  Has to straighten back pain for relief.  Denies LOF.  Active fetus.  Pt left room yesterday with husband for meal after this episode. Pt becomes tearful.  Afraid she is still bleeding.  Feels that the brown discharge "can't be good."  Pt sees NST and knows baby is OK.  Declines more frequent monitoring.  Pt is not currently wearing a pad.  Recommended wearing pad to provide more of a visual regarding bleeding.  Pt agreeable. Abdominal itching has resolved. Not sleeping well, despite Ambien.  Got Flexeril over the weekend which helped with sleep briefly.  Pt ready to have the baby.  Objective: Vital signs in last 24 hours: Temp:  [98 F (36.7 C)-98.4 F (36.9 C)] 98 F (36.7 C) (09/22 1215) Pulse Rate:  [82-94] 86 (09/22 1215) Resp:  [16-18] 16 (09/22 1215) BP: (101-126)/(56-71) 120/65 mmHg (09/22 1215)  Physical Exam:  General: alert, cooperative and no distress Uterine Fundus: Nontender Abdomen:   No rash or lesions. DVT Evaluation: No evidence of DVT seen on physical exam. TED hose off.  EFM:   Reactive, irregular contractions, ~5-8 seen. No results found for this basename: HGB, HCT,  in the last 72 hours  Assessment/Plan: 35 5/7 weeks, low lying placenta (0.5 cm from os).  S/p BMZ.  Admitted for vaginal bleeding.   Currently stable. No active vaginal bleeding today. Fetal status reassuring, Cat 1.  NST x 1 hour TID. Anemia.  Fergon daily. Proceed with primary LTCS in the event of active bleeding, fetal distress or labor. Continue inpatient bedrest.  Pt lives 45 minutes away. Ultrasound at 36 weeks to reevaluate placenta and assess growth. Pt reassured of current status.  Informed that inpatient bedrest can increase emotional lability.  Encouraged to get out of room and get  more sunlight as long as stable. Check Hg closer to surgery which is in ~1 week. Primary c-section scheduled Tues. Sept.30th at 10:30 am.  Will ask RN to inform pt of surgery time.  Thurnell Lose 02/14/2013, 2:06 PM

## 2013-02-14 NOTE — Progress Notes (Signed)
Ms Scibilia is having a difficult time coping with being here.  She is normally a busy and independent person and feels as if both of those pieces have been taken away from her while she is waiting to see what will happen with her baby.  She also feels as if she is getting different stories from different physicians about medications and plans.  She is concerned for her baby and feels anxious with all of the uncertainty and without being able to really "see" her baby.  I provided emotional support and a space to share her feelings.  We will continue to check in with her regularly to provide support, but please also page as needs arise.  [employer] Pager, [phone] 4:18 PM

## 2013-02-15 ENCOUNTER — Inpatient Hospital Stay (HOSPITAL_COMMUNITY): Payer: BC Managed Care – PPO

## 2013-02-15 LAB — CBC
HCT: 32.4 % — ABNORMAL LOW (ref 36.0–46.0)
Hemoglobin: 10.6 g/dL — ABNORMAL LOW (ref 12.0–15.0)
MCH: 27.5 pg (ref 26.0–34.0)
MCHC: 32.7 g/dL (ref 30.0–36.0)

## 2013-02-15 MED ORDER — FERROUS GLUCONATE 324 (38 FE) MG PO TABS
324.0000 mg | ORAL_TABLET | Freq: Two times a day (BID) | ORAL | Status: DC
  Administered 2013-02-15 – 2013-02-19 (×7): 324 mg via ORAL
  Filled 2013-02-15 (×11): qty 1

## 2013-02-15 NOTE — Progress Notes (Signed)
Pt seen and examined. No complaints.  Stable overnight. VSS NST x 2 reviewed and reassuring. Continue current management. Full note to follow.

## 2013-02-15 NOTE — Progress Notes (Signed)
02/15/13 1200  Clinical Encounter Type  Visited With Patient  Visit Type Follow-up;Spiritual support;Social support  Referral From Chaplain Janne Napoleon)  Spiritual Encounters  Spiritual Needs Emotional  Stress Factors  Patient Stress Factors Family relationships   Referred by Kathrynn Humble for follow-up after Caylen had a particularly stressful day.  Natalie Barker was in better spirits today.  She is someone who likes to plan ahead and wants to have as much information as possible and to know what to anticipate, so having her ultrasound and c-section scheduled helps meet a spiritual and emotional need.  Today she shared extensively about stressful relationships in her extended family.  I provided pastoral reflection and affirmed the strengths she presented.  Spiritual Care will continue to follow for support, but please also page as needs arise.  Thank you.  [place], [place]

## 2013-02-15 NOTE — Progress Notes (Addendum)
HD #12  Subjective: Pt without complaints.  No bleeding or LOF.  Wore pad yesterday and just had brown discharge.  Active fetus.  Contractions have decreased  Objective: Vital signs in last 24 hours: Temp:  [98 F (36.7 C)-98.5 F (36.9 C)] 98.5 F (36.9 C) (09/23 1238) Pulse Rate:  [82-98] 95 (09/23 1238) Resp:  [16-18] 16 (09/23 1238) BP: (93-121)/(50-71) 106/59 mmHg (09/23 1238)  Physical Exam:  General: alert, cooperative and no distress, Resting quietly Uterine Fundus: Nontender Abdomen:   No rash or lesions. DVT Evaluation: No evidence of DVT seen on physical exam. TED hose off.  NST lunch, dinner reviewed.  Reactive x 2.  Irritability, rare contractions.  Recent Labs  02/15/13 0916  HGB 10.6*  HCT 32.4*    Assessment/Plan: 36 0/7 weeks, low lying placenta (0.5 cm from os).  S/p BMZ.  GBS neg. Currently stable. H/o vaginal bleeding.  Last episode 2 days ago.  Currently having brown discharge but not actively bleeding. Fetal status reassuring, Cat I Anemia.  Mood changes.  Likely due to stress of high risk pregnancy and prolonged hospitalization.  Continue inpatient bedrest.  Pt lives 45 minutes away. F/u ultrasound on 9/26 at 36 3/7 to assess placenta and EFW. T&S q 3 days per protocol Continue NST x 1 hour TID Check CBC today with T&S.  Increase iron supplementation to BID prn. Appreciate spiritual and educational counseling.  Out or room daily. Proceed with primary LTCS in the event of active bleeding, fetal distress or labor.  Thurnell Lose 02/15/2013, 1:14 PM

## 2013-02-16 ENCOUNTER — Encounter (HOSPITAL_COMMUNITY): Payer: Self-pay | Admitting: Anesthesiology

## 2013-02-16 ENCOUNTER — Inpatient Hospital Stay (HOSPITAL_COMMUNITY): Payer: BC Managed Care – PPO | Admitting: Anesthesiology

## 2013-02-16 ENCOUNTER — Encounter (HOSPITAL_COMMUNITY)
Admission: AD | Disposition: A | Discharge status: Home / Self Care | Payer: Self-pay | Source: Ambulatory Visit | Attending: Obstetrics and Gynecology

## 2013-02-16 LAB — TYPE AND SCREEN
ABO/RH(D): O POS
Antibody Screen: NEGATIVE
Unit division: 0

## 2013-02-16 SURGERY — Surgical Case
Anesthesia: Spinal | Site: Abdomen | Wound class: Clean Contaminated

## 2013-02-16 MED ORDER — LANOLIN HYDROUS EX OINT: 1.0000 "application " | TOPICAL_OINTMENT | CUTANEOUS | Status: DC | PRN

## 2013-02-16 MED ORDER — METOCLOPRAMIDE HCL 5 MG/ML IJ SOLN
INTRAMUSCULAR | Status: AC
  Administered 2013-02-16: 10 mg via INTRAVENOUS
  Filled 2013-02-16: qty 2

## 2013-02-16 MED ORDER — WITCH HAZEL-GLYCERIN EX PADS: 1.0000 "application " | MEDICATED_PAD | CUTANEOUS | Status: DC | PRN

## 2013-02-16 MED ORDER — KETOROLAC TROMETHAMINE 30 MG/ML IJ SOLN
30.0000 mg | Freq: Four times a day (QID) | INTRAMUSCULAR | Status: AC | PRN
  Administered 2013-02-16 (×2): 30 mg via INTRAVENOUS
  Filled 2013-02-16: qty 1

## 2013-02-16 MED ORDER — PROMETHAZINE HCL 25 MG/ML IJ SOLN
INTRAMUSCULAR | Status: AC
  Administered 2013-02-16: 12.5 mg via INTRAVENOUS
  Filled 2013-02-16: qty 1

## 2013-02-16 MED ORDER — FENTANYL CITRATE 0.05 MG/ML IJ SOLN
INTRAMUSCULAR | Status: AC
  Filled 2013-02-16: qty 2

## 2013-02-16 MED ORDER — PHENYLEPHRINE 40 MCG/ML (10ML) SYRINGE FOR IV PUSH (FOR BLOOD PRESSURE SUPPORT)
PREFILLED_SYRINGE | INTRAVENOUS | Status: AC
  Filled 2013-02-16: qty 10

## 2013-02-16 MED ORDER — SENNOSIDES-DOCUSATE SODIUM 8.6-50 MG PO TABS
2.0000 | ORAL_TABLET | ORAL | Status: DC
  Administered 2013-02-16 – 2013-02-18 (×3): 2 via ORAL

## 2013-02-16 MED ORDER — SCOPOLAMINE 1 MG/3DAYS TD PT72
MEDICATED_PATCH | TRANSDERMAL | Status: AC
  Administered 2013-02-16: 1.5 mg via TRANSDERMAL
  Filled 2013-02-16: qty 1

## 2013-02-16 MED ORDER — PRENATAL MULTIVITAMIN CH
1.0000 | ORAL_TABLET | Freq: Every day | ORAL | Status: DC
  Administered 2013-02-18: 1 via ORAL
  Filled 2013-02-16: qty 1

## 2013-02-16 MED ORDER — SCOPOLAMINE 1 MG/3DAYS TD PT72
1.0000 | MEDICATED_PATCH | Freq: Once | TRANSDERMAL | Status: AC
  Administered 2013-02-16: 1.5 mg via TRANSDERMAL

## 2013-02-16 MED ORDER — ONDANSETRON HCL 4 MG/2ML IJ SOLN
INTRAMUSCULAR | Status: DC | PRN
  Administered 2013-02-16: 4 mg via INTRAVENOUS

## 2013-02-16 MED ORDER — LACTATED RINGERS IV SOLN
INTRAVENOUS | Status: DC | PRN
  Administered 2013-02-16 (×2): via INTRAVENOUS

## 2013-02-16 MED ORDER — SIMETHICONE 80 MG PO CHEW: 80.0000 mg | CHEWABLE_TABLET | ORAL | Status: DC | PRN

## 2013-02-16 MED ORDER — MENTHOL 3 MG MT LOZG
1.0000 | LOZENGE | OROMUCOSAL | Status: DC | PRN
  Administered 2013-02-17: 3 mg via ORAL
  Filled 2013-02-16: qty 9

## 2013-02-16 MED ORDER — SODIUM CHLORIDE 0.9 % IJ SOLN: 3.0000 mL | INTRAMUSCULAR | Status: DC | PRN

## 2013-02-16 MED ORDER — METOCLOPRAMIDE HCL 5 MG/ML IJ SOLN: 10.0000 mg | Freq: Once | INTRAMUSCULAR | Status: DC | PRN

## 2013-02-16 MED ORDER — IBUPROFEN 600 MG PO TABS
600.0000 mg | ORAL_TABLET | Freq: Four times a day (QID) | ORAL | Status: DC
  Administered 2013-02-17 – 2013-02-19 (×9): 600 mg via ORAL
  Filled 2013-02-16 (×10): qty 1

## 2013-02-16 MED ORDER — ONDANSETRON HCL 4 MG PO TABS: 4.0000 mg | ORAL_TABLET | ORAL | Status: DC | PRN

## 2013-02-16 MED ORDER — NALBUPHINE SYRINGE 5 MG/0.5 ML
5.0000 mg | INJECTION | INTRAMUSCULAR | Status: DC | PRN
  Filled 2013-02-16: qty 1

## 2013-02-16 MED ORDER — PHENYLEPHRINE 40 MCG/ML (10ML) SYRINGE FOR IV PUSH (FOR BLOOD PRESSURE SUPPORT)
PREFILLED_SYRINGE | INTRAVENOUS | Status: AC
  Filled 2013-02-16: qty 5

## 2013-02-16 MED ORDER — FENTANYL CITRATE 0.05 MG/ML IJ SOLN
INTRAMUSCULAR | Status: DC | PRN
  Administered 2013-02-16: 25 ug via INTRATHECAL

## 2013-02-16 MED ORDER — OXYTOCIN 40 UNITS IN LACTATED RINGERS INFUSION - SIMPLE MED: 62.5000 mL/h | INTRAVENOUS | Status: AC

## 2013-02-16 MED ORDER — DEXTROSE 5 % IV SOLN
INTRAVENOUS | Status: AC
  Administered 2013-02-16: 100 mL via INTRAVENOUS
  Filled 2013-02-16: qty 9.25

## 2013-02-16 MED ORDER — DIBUCAINE 1 % RE OINT: 1.0000 "application " | TOPICAL_OINTMENT | RECTAL | Status: DC | PRN

## 2013-02-16 MED ORDER — OXYTOCIN 10 UNIT/ML IJ SOLN
INTRAMUSCULAR | Status: AC
  Filled 2013-02-16: qty 4

## 2013-02-16 MED ORDER — NALOXONE HCL 0.4 MG/ML IJ SOLN: 0.4000 mg | INTRAMUSCULAR | Status: DC | PRN

## 2013-02-16 MED ORDER — MORPHINE SULFATE 0.5 MG/ML IJ SOLN
INTRAMUSCULAR | Status: AC
  Filled 2013-02-16: qty 10

## 2013-02-16 MED ORDER — METOCLOPRAMIDE HCL 5 MG/ML IJ SOLN
10.0000 mg | Freq: Three times a day (TID) | INTRAMUSCULAR | Status: DC | PRN
  Administered 2013-02-16: 10 mg via INTRAVENOUS

## 2013-02-16 MED ORDER — ONDANSETRON HCL 4 MG/2ML IJ SOLN
INTRAMUSCULAR | Status: AC
  Filled 2013-02-16: qty 2

## 2013-02-16 MED ORDER — SIMETHICONE 80 MG PO CHEW
80.0000 mg | CHEWABLE_TABLET | Freq: Three times a day (TID) | ORAL | Status: DC
  Administered 2013-02-16 – 2013-02-19 (×8): 80 mg via ORAL

## 2013-02-16 MED ORDER — SIMETHICONE 80 MG PO CHEW
80.0000 mg | CHEWABLE_TABLET | ORAL | Status: DC
  Administered 2013-02-17: 80 mg via ORAL

## 2013-02-16 MED ORDER — 0.9 % SODIUM CHLORIDE (POUR BTL) OPTIME
TOPICAL | Status: DC | PRN
  Administered 2013-02-16: 1000 mL

## 2013-02-16 MED ORDER — OXYTOCIN 10 UNIT/ML IJ SOLN
40.0000 [IU] | INTRAVENOUS | Status: DC | PRN
  Administered 2013-02-16: 40 [IU] via INTRAVENOUS

## 2013-02-16 MED ORDER — CITRIC ACID-SODIUM CITRATE 334-500 MG/5ML PO SOLN
ORAL | Status: AC
  Filled 2013-02-16: qty 15

## 2013-02-16 MED ORDER — DIPHENHYDRAMINE HCL 50 MG/ML IJ SOLN: 25.0000 mg | INTRAMUSCULAR | Status: DC | PRN

## 2013-02-16 MED ORDER — NALOXONE HCL 1 MG/ML IJ SOLN
1.0000 ug/kg/h | INTRAVENOUS | Status: DC | PRN
  Filled 2013-02-16: qty 2

## 2013-02-16 MED ORDER — KETOROLAC TROMETHAMINE 30 MG/ML IJ SOLN: 30.0000 mg | Freq: Four times a day (QID) | INTRAMUSCULAR | Status: AC | PRN

## 2013-02-16 MED ORDER — OXYCODONE-ACETAMINOPHEN 5-325 MG PO TABS
1.0000 | ORAL_TABLET | ORAL | Status: DC | PRN
  Administered 2013-02-17: 2 via ORAL
  Administered 2013-02-17: 1 via ORAL
  Administered 2013-02-17 – 2013-02-18 (×2): 2 via ORAL
  Administered 2013-02-18 – 2013-02-19 (×2): 1 via ORAL
  Filled 2013-02-16: qty 1
  Filled 2013-02-16 (×2): qty 2
  Filled 2013-02-16: qty 1
  Filled 2013-02-16: qty 2
  Filled 2013-02-16: qty 1

## 2013-02-16 MED ORDER — MORPHINE SULFATE (PF) 0.5 MG/ML IJ SOLN
INTRAMUSCULAR | Status: DC | PRN
  Administered 2013-02-16: .15 mg via INTRATHECAL

## 2013-02-16 MED ORDER — DIPHENHYDRAMINE HCL 25 MG PO CAPS
25.0000 mg | ORAL_CAPSULE | ORAL | Status: DC | PRN
  Administered 2013-02-17 (×2): 25 mg via ORAL
  Filled 2013-02-16: qty 1

## 2013-02-16 MED ORDER — DIPHENHYDRAMINE HCL 25 MG PO CAPS: 25.0000 mg | ORAL_CAPSULE | Freq: Four times a day (QID) | ORAL | Status: DC | PRN

## 2013-02-16 MED ORDER — LACTATED RINGERS IV SOLN
INTRAVENOUS | Status: DC
  Administered 2013-02-16: 21:00:00 via INTRAVENOUS

## 2013-02-16 MED ORDER — FENTANYL CITRATE 0.05 MG/ML IJ SOLN
25.0000 ug | INTRAMUSCULAR | Status: DC | PRN
  Administered 2013-02-16 (×2): 50 ug via INTRAVENOUS

## 2013-02-16 MED ORDER — ZOLPIDEM TARTRATE 5 MG PO TABS: 5.0000 mg | ORAL_TABLET | Freq: Every evening | ORAL | Status: DC | PRN

## 2013-02-16 MED ORDER — BUPIVACAINE IN DEXTROSE 0.75-8.25 % IT SOLN
INTRATHECAL | Status: DC | PRN
  Administered 2013-02-16: 15 mg via INTRATHECAL

## 2013-02-16 MED ORDER — TETANUS-DIPHTH-ACELL PERTUSSIS 5-2.5-18.5 LF-MCG/0.5 IM SUSP: 0.5000 mL | Freq: Once | INTRAMUSCULAR | Status: DC

## 2013-02-16 MED ORDER — ONDANSETRON HCL 4 MG/2ML IJ SOLN: 4.0000 mg | Freq: Three times a day (TID) | INTRAMUSCULAR | Status: DC | PRN

## 2013-02-16 MED ORDER — MEPERIDINE HCL 25 MG/ML IJ SOLN: 6.2500 mg | INTRAMUSCULAR | Status: DC | PRN

## 2013-02-16 MED ORDER — DIPHENHYDRAMINE HCL 50 MG/ML IJ SOLN: 12.5000 mg | INTRAMUSCULAR | Status: DC | PRN

## 2013-02-16 MED ORDER — PHENYLEPHRINE HCL 10 MG/ML IJ SOLN
INTRAMUSCULAR | Status: DC | PRN
  Administered 2013-02-16 (×5): 80 ug via INTRAVENOUS

## 2013-02-16 MED ORDER — ONDANSETRON HCL 4 MG/2ML IJ SOLN: 4.0000 mg | INTRAMUSCULAR | Status: DC | PRN

## 2013-02-16 MED ORDER — LACTATED RINGERS IV SOLN
INTRAVENOUS | Status: DC
  Administered 2013-02-16: 13:00:00 via INTRAVENOUS

## 2013-02-16 MED ORDER — KETOROLAC TROMETHAMINE 30 MG/ML IJ SOLN
INTRAMUSCULAR | Status: AC
  Administered 2013-02-16: 30 mg via INTRAVENOUS
  Filled 2013-02-16: qty 1

## 2013-02-16 MED ORDER — PROMETHAZINE HCL 25 MG/ML IJ SOLN
6.2500 mg | INTRAMUSCULAR | Status: DC | PRN
  Administered 2013-02-16: 12.5 mg via INTRAVENOUS

## 2013-02-16 MED ORDER — NALBUPHINE SYRINGE 5 MG/0.5 ML
5.0000 mg | INJECTION | INTRAMUSCULAR | Status: DC | PRN
Start: 2013-02-16 — End: 2013-02-19
  Filled 2013-02-16: qty 1

## 2013-02-16 MED ORDER — FENTANYL CITRATE 0.05 MG/ML IJ SOLN
INTRAMUSCULAR | Status: AC
  Administered 2013-02-16: 50 ug via INTRAVENOUS
  Filled 2013-02-16: qty 2

## 2013-02-16 SURGICAL SUPPLY — 44 items
ADH SKN CLS APL DERMABOND .7 (GAUZE/BANDAGES/DRESSINGS)
APL SKNCLS STERI-STRIP NONHPOA (GAUZE/BANDAGES/DRESSINGS) ×1
BARRIER ADHS 3X4 INTERCEED (GAUZE/BANDAGES/DRESSINGS) ×1 IMPLANT
BENZOIN TINCTURE PRP APPL 2/3 (GAUZE/BANDAGES/DRESSINGS) ×1 IMPLANT
BRR ADH 4X3 ABS CNTRL BYND (GAUZE/BANDAGES/DRESSINGS) ×1
CLAMP CORD UMBIL (MISCELLANEOUS) IMPLANT
CLEANER TIP ELECTROSURG 2X2 (MISCELLANEOUS) ×2 IMPLANT
CLOTH BEACON ORANGE TIMEOUT ST (SAFETY) ×2 IMPLANT
DERMABOND ADVANCED (GAUZE/BANDAGES/DRESSINGS)
DERMABOND ADVANCED .7 DNX12 (GAUZE/BANDAGES/DRESSINGS) IMPLANT
DEVICE BLD TRNS LUER ATTCH (MISCELLANEOUS) ×2 IMPLANT
DRAPE LG THREE QUARTER DISP (DRAPES) ×4 IMPLANT
DRSG OPSITE POSTOP 4X10 (GAUZE/BANDAGES/DRESSINGS) ×2 IMPLANT
DURAPREP 26ML APPLICATOR (WOUND CARE) ×1 IMPLANT
ELECT REM PT RETURN 9FT ADLT (ELECTROSURGICAL) ×2
ELECTRODE REM PT RTRN 9FT ADLT (ELECTROSURGICAL) ×1 IMPLANT
EXTRACTOR VACUUM BELL STYLE (SUCTIONS) IMPLANT
GLOVE BIO SURGEON STRL SZ7 (GLOVE) ×2 IMPLANT
GLOVE BIOGEL PI IND STRL 7.0 (GLOVE) ×1 IMPLANT
GLOVE BIOGEL PI INDICATOR 7.0 (GLOVE) ×1
GOWN PREVENTION PLUS XLARGE (GOWN DISPOSABLE) ×4 IMPLANT
GOWN STRL REIN XL XLG (GOWN DISPOSABLE) ×4 IMPLANT
KIT ABG SYR 3ML LUER SLIP (SYRINGE) IMPLANT
NDL HYPO 25X5/8 SAFETYGLIDE (NEEDLE) IMPLANT
NEEDLE HYPO 25X5/8 SAFETYGLIDE (NEEDLE) IMPLANT
NS IRRIG 1000ML POUR BTL (IV SOLUTION) ×2 IMPLANT
PACK C SECTION WH (CUSTOM PROCEDURE TRAY) ×2 IMPLANT
PAD OB MATERNITY 4.3X12.25 (PERSONAL CARE ITEMS) ×2 IMPLANT
PENCIL BUTTON HOLSTER BLD 10FT (ELECTRODE) ×2 IMPLANT
RTRCTR C-SECT PINK 25CM LRG (MISCELLANEOUS) ×2 IMPLANT
STRIP CLOSURE SKIN 1/2X4 (GAUZE/BANDAGES/DRESSINGS) ×1 IMPLANT
SUT CHROMIC 0 CTX 36 (SUTURE) ×10 IMPLANT
SUT PLAIN 2 0 (SUTURE) ×4
SUT PLAIN 2 0 XLH (SUTURE) ×2 IMPLANT
SUT PLAIN ABS 2-0 54XMFL TIE (SUTURE) IMPLANT
SUT PLAIN ABS 2-0 CT1 27XMFL (SUTURE) IMPLANT
SUT VIC AB 0 CT1 27 (SUTURE) ×4
SUT VIC AB 0 CT1 27XBRD ANBCTR (SUTURE) ×2 IMPLANT
SUT VIC AB 2-0 CT1 27 (SUTURE) ×2
SUT VIC AB 2-0 CT1 TAPERPNT 27 (SUTURE) IMPLANT
SUT VIC AB 4-0 KS 27 (SUTURE) ×2 IMPLANT
TOWEL OR 17X24 6PK STRL BLUE (TOWEL DISPOSABLE) ×2 IMPLANT
TRAY FOLEY CATH 14FR (SET/KITS/TRAYS/PACK) IMPLANT
WATER STERILE IRR 1000ML POUR (IV SOLUTION) ×2 IMPLANT

## 2013-02-16 NOTE — Anesthesia Procedure Notes (Signed)
Spinal  Patient location during procedure: OR Start time: 02/16/2013 2:28 PM Staffing Anesthesiologist: Charle Mclaurin A. Preanesthetic Checklist Completed: patient identified, site marked, surgical consent, pre-op evaluation, timeout performed, IV checked, risks and benefits discussed and monitors and equipment checked Spinal Block Patient position: sitting Prep: site prepped and draped and DuraPrep Patient monitoring: heart rate, cardiac monitor, continuous pulse ox and blood pressure Approach: midline Location: L3-4 Injection technique: single-shot Needle Needle type: Sprotte  Needle gauge: 24 G Needle length: 9 cm Needle insertion depth: 5 cm Assessment Sensory level: T4 Additional Notes Patient tolerated procedure well. Adequate sensory level.

## 2013-02-16 NOTE — Consult Note (Signed)
Neonatology Note:   Attendance at C-section:    I was asked by Dr. [name] to attend this primary C/S at 36 1/7 weeks due to placenta previa with vaginal bleeding. The mother is a G1P0 O pos, GBS neg with placenta previa. She has been admitted previously and received Betamethasone at [redacted] weeks GA. ROM at delivery, fluid clear. Infant vigorous with good spontaneous cry and tone. Needed only minimal bulb suctioning. Ap 9/9. Lungs clear to ausc in DR. The baby had very minimal subcostal retractions, but was moving air well and remaining quite pink in room air. I instructed RN supervising skin to skin time to observe the baby closely and take her to the CN if there was any distress. To CN to care of Pediatrician.   Real Cons, MD

## 2013-02-16 NOTE — Transfer of Care (Signed)
Immediate Anesthesia Transfer of Care Note  Patient: Natalie Barker  Procedure(s) Performed: Procedure(s): CESAREAN SECTION (N/A)  Patient Location: PACU  Anesthesia Type:Spinal  Level of Consciousness: awake, alert  and oriented  Airway & Oxygen Therapy: Patient Spontanous Breathing  Post-op Assessment: Report given to PACU RN and Post -op Vital signs reviewed and stable  Post vital signs: stable  Complications: No apparent anesthesia complications

## 2013-02-16 NOTE — Progress Notes (Signed)
HD #13  Subjective: Pt reports bright red blood when she wiped after urinating.  Blood noticed in toilet, no clots.  Currently with brown discharge on pad.  Amount on pad unchanged x 2 hours.  Pt feels like she is having period and blood is coming out.  Denies abdominal pain or LOF.  Ate french toast and bacon at 11:15 am.  Objective: Vital signs in last 24 hours: Temp:  [97.9 F (36.6 C)-98.5 F (36.9 C)] 97.9 F (36.6 C) (09/24 1158) Pulse Rate:  [87-110] 96 (09/24 1158) Resp:  [16-18] 18 (09/24 1158) BP: (106-123)/(59-74) 114/63 mmHg (09/24 1158) Weight:  [85.186 kg (187 lb 12.8 oz)] 85.186 kg (187 lb 12.8 oz) (09/24 0955)  Physical Exam:  General: alert, cooperative and no distress, ambulating without difficulty Uterine Fundus: Nontender Abdomen:   No rash or lesions. DVT Evaluation: No evidence of DVT seen on physical exam. TED hose off. Pelvic:  Min-moderate amount of brown discharge on pad.  Perineum with bright red blood.  SVE of lower 1/3 of vagina with bright red blood then appeared on pad. Reactive.  Irritability, few contractions.  Recent Labs  02/15/13 0916  HGB 10.6*  HCT 32.4*    Assessment/Plan: 36 1/7 weeks, low lying placenta (0.5 cm from os).  S/p BMZ.  GBS neg.  Now with mild active bleeding. Fetal status reassuring, Cat I  Proceed with primary c-section. Type and match for 2 units. Fetal monitoring until surgery. Pt counseled on R/B/A, including bleeding requiring transfusion and hysterectomy in the event of accreta.  Risk of aspiration also reviewed. Pt verbalized understanding.  All questions answered. Thurnell Lose 02/16/2013, 12:26 PM

## 2013-02-16 NOTE — Anesthesia Preprocedure Evaluation (Signed)
Anesthesia Evaluation  Patient identified by MRN, date of birth, ID band Patient awake    Reviewed: Allergy & Precautions, H&P , NPO status , Patient's Chart, lab work & pertinent test results  Airway Mallampati: II TM Distance: >3 FB Neck ROM: Full    Dental no notable dental hx. (+) Teeth Intact   Pulmonary asthma ,  breath sounds clear to auscultation  Pulmonary exam normal       Cardiovascular negative cardio ROS  Rhythm:Regular Rate:Normal     Neuro/Psych negative neurological ROS  negative psych ROS   GI/Hepatic Neg liver ROS, GERD-  Medicated and Controlled,  Endo/Other  negative endocrine ROS  Renal/GU Renal diseaseHx/o Pyelonephritis  negative genitourinary   Musculoskeletal negative musculoskeletal ROS (+)   Abdominal   Peds  Hematology  (+) Blood dyscrasia, anemia ,   Anesthesia Other Findings   Reproductive/Obstetrics                           Anesthesia Physical Anesthesia Plan  ASA: II and emergent  Anesthesia Plan: Spinal   Post-op Pain Management:    Induction:   Airway Management Planned: Natural Airway  Additional Equipment:   Intra-op Plan:   Post-operative Plan:   Informed Consent: I have reviewed the patients History and Physical, chart, labs and discussed the procedure including the risks, benefits and alternatives for the proposed anesthesia with the patient or authorized representative who has indicated his/her understanding and acceptance.     Plan Discussed with: CRNA, Anesthesiologist and Surgeon  Anesthesia Plan Comments:         Anesthesia Quick Evaluation

## 2013-02-16 NOTE — Anesthesia Postprocedure Evaluation (Signed)
  Anesthesia Post-op Note  Patient: Natalie Barker  Procedure(s) Performed: Procedure(s): CESAREAN SECTION (N/A)  Patient Location: PACU  Anesthesia Type:Spinal  Level of Consciousness: awake, alert  and oriented  Airway and Oxygen Therapy: Patient Spontanous Breathing  Post-op Pain: none  Post-op Assessment: Post-op Vital signs reviewed, Patient's Cardiovascular Status Stable, Respiratory Function Stable, Patent Airway, No signs of Nausea or vomiting, Pain level controlled, No headache and No backache  Post-op Vital Signs: Reviewed and stable  Complications: No apparent anesthesia complications

## 2013-02-17 ENCOUNTER — Encounter (HOSPITAL_COMMUNITY): Payer: Self-pay | Admitting: Obstetrics and Gynecology

## 2013-02-17 LAB — RPR: RPR Ser Ql: NONREACTIVE

## 2013-02-17 LAB — CBC
HCT: 30.9 % — ABNORMAL LOW (ref 36.0–46.0)
Hemoglobin: 10.2 g/dL — ABNORMAL LOW (ref 12.0–15.0)
RBC: 3.66 MIL/uL — ABNORMAL LOW (ref 3.87–5.11)
RDW: 14 % (ref 11.5–15.5)
WBC: 16.6 10*3/uL — ABNORMAL HIGH (ref 4.0–10.5)

## 2013-02-17 NOTE — Progress Notes (Signed)
02/17/13 1400  Clinical Encounter Type  Visited With Patient and family together (baby Natalie Barker)  Visit Type Follow-up;Spiritual support;Social support  Spiritual Encounters  Spiritual Needs Emotional   Followed up to offer congratulations and blessings after delivery.  Natalie Barker was tired, but upbeat and relieved to be on the other side of delivery with strong and healthy Natalie Barker by her side.  Provided opportunity for her to share and process important parts of her birth story.    [place], [place]

## 2013-02-17 NOTE — Progress Notes (Signed)
Subjective: Postpartum Day 1: Cesarean Delivery Patient without complaints.  Bleeding decreasing.  Pain controlled.    Objective: Vital signs in last 24 hours: Temp:  [97.4 F (36.3 C)-98.7 F (37.1 C)] 98.5 F (36.9 C) (09/25 0851) Pulse Rate:  [71-111] 76 (09/25 0851) Resp:  [12-22] 20 (09/25 0851) BP: (100-120)/(57-80) 106/62 mmHg (09/25 0851) SpO2:  [96 %-100 %] 98 % (09/25 0851) Weight:  [85.186 kg (187 lb 12.8 oz)] 85.186 kg (187 lb 12.8 oz) (09/24 0955)  Physical Exam:  General: alert, cooperative and no distress Lochia: appropriate Uterine Fundus: firm Incision: Dressing clean. DVT Evaluation: No evidence of DVT seen on physical exam. Calf/Ankle edema is present.   Recent Labs  02/15/13 0916 02/17/13 0615  HGB 10.6* 10.2*  HCT 32.4* 30.9*    Assessment/Plan: Status post Cesarean section. Doing well postoperatively.  Continue current care. Encouraged ambulation TID.  Thurnell Lose 02/17/2013, 9:12 AM

## 2013-02-17 NOTE — Anesthesia Postprocedure Evaluation (Signed)
  Anesthesia Post-op Note  Anesthesia Post Note  Patient: Natalie Barker  Procedure(s) Performed: Procedure(s) (LRB): CESAREAN SECTION (N/A)  Anesthesia type: Spinal  Patient location: Mother/Baby  Post pain: Pain level controlled  Post assessment: Post-op Vital signs reviewed  Last Vitals:  Filed Vitals:   02/17/13 0520  BP: 100/60  Pulse: 83  Temp: 37 C  Resp: 18    Post vital signs: Reviewed  Level of consciousness: awake  Complications: No apparent anesthesia complications

## 2013-02-18 ENCOUNTER — Encounter (HOSPITAL_COMMUNITY): Payer: BC Managed Care – PPO

## 2013-02-18 ENCOUNTER — Inpatient Hospital Stay (HOSPITAL_COMMUNITY): Payer: BC Managed Care – PPO

## 2013-02-18 NOTE — Progress Notes (Signed)
Subjective: Postpartum Day 2: Cesarean Delivery Patient reports tolerating PO and no problems voiding.    Objective: Vital signs in last 24 hours: Temp:  [98.4 F (36.9 C)-98.8 F (37.1 C)] 98.8 F (37.1 C) (09/26 0452) Pulse Rate:  [65-84] 84 (09/26 0452) Resp:  [19-20] 19 (09/26 0452) BP: (91-104)/(51-61) 104/61 mmHg (09/26 0452) SpO2:  [96 %-99 %] 99 % (09/26 0452)  Physical Exam:  General: alert and cooperative Lochia: appropriate Uterine Fundus: firm Incision: healing well DVT Evaluation: No evidence of DVT seen on physical exam.   Recent Labs  02/17/13 0615  HGB 10.2*  HCT 30.9*    Assessment/Plan: Status post Cesarean section. Doing well postoperatively.  D/C home in AM.. Pt to return to office in 2 wks for incision check .  Yena Tisby J. 02/18/2013, 1:14 PM

## 2013-02-18 NOTE — Progress Notes (Signed)
UR chart review completed.  

## 2013-02-19 LAB — TYPE AND SCREEN
ABO/RH(D): O POS
Unit division: 0

## 2013-02-19 MED ORDER — BISACODYL 10 MG RE SUPP
10.0000 mg | Freq: Once | RECTAL | Status: DC
  Filled 2013-02-19: qty 1

## 2013-02-19 MED ORDER — POLYETHYLENE GLYCOL 3350 17 G PO PACK: 17.0000 g | PACK | Freq: Every day | ORAL | Status: DC

## 2013-02-19 MED ORDER — IBUPROFEN 800 MG PO TABS: 800.0000 mg | ORAL_TABLET | Freq: Three times a day (TID) | ORAL | Status: DC | PRN

## 2013-02-19 MED ORDER — OXYCODONE-ACETAMINOPHEN 5-325 MG PO TABS: 1.0000 | ORAL_TABLET | ORAL | Status: DC | PRN

## 2013-02-19 MED ORDER — NORETHINDRONE 0.35 MG PO TABS: 1.0000 | ORAL_TABLET | Freq: Every day | ORAL | Status: DC

## 2013-02-19 NOTE — Progress Notes (Signed)
Subjective:  Postpartum Day 3: Cesarean Delivery Patient reports tolerating PO and no problems voiding.    Objective: Vital signs in last 24 hours: Temp:  [98 F (36.7 C)-98.3 F (36.8 C)] 98 F (36.7 C) (09/27 0559) Pulse Rate:  [71-76] 71 (09/27 0559) Resp:  [18-20] 18 (09/27 0559) BP: (102-105)/(61-63) 102/63 mmHg (09/27 0559)  Physical Exam:  General: no distress Lochia: appropriate Uterine Fundus: firm Incision: healing well DVT Evaluation: No evidence of DVT seen on physical exam.   Recent Labs  02/17/13 0615  HGB 10.2*  HCT 30.9*    Assessment/Plan: Status post Cesarean section. Doing well postoperatively.  Discharge with standard precautions and return to clinic in 2 weeks. The baby will remain in the hospital for the moment. The mother will be discharged but will "room in" with the baby. Breast and bottle feeding. Undecided about contraception but will try progesterone only birth control pills.  Juli Odom V 02/19/2013, 11:06 AM

## 2013-02-22 SURGERY — Surgical Case
Anesthesia: *Unknown

## 2013-03-29 NOTE — Discharge Summary (Signed)
Obstetric Discharge Summary Reason for Admission: Vaginal bleeding, marginal previa Prenatal Procedures: NST and ultrasound Intrapartum Procedures: cesarean: low cervical, transverse Postpartum Procedures: none Complications-Operative and Postpartum: none Hemoglobin  Date Value Range Status  02/17/2013 10.2* 12.0 - 15.0 g/dL Final     HCT  Date Value Range Status  02/17/2013 30.9* 36.0 - 46.0 % Final  Hospital course:  Pt admitted for vaginal bleeding at 34 2/7 weeks.  Initially, pt was to be observed for 72 hours but she had a second bleed.  Pt remained on inpatient bedrest.  NSTs q 8 hours were reassuring.  Pt would pass clots sporadically but would not have prolonged bleeding. On hospital day #13 at 36 1/7 weeks, she had bright red blood, min-moderate amount.  Was not contracting and fetal status was reassuring. Primary c-section was done.  Placenta was actually partially anterior.  Baby delivered through placenta atraumatically.  Placenta was not an accreta.  Surgery was uncomplicated.  Physical Exam:  See progress note. Day of discharge.  Discharge Diagnoses: Preterm pregnancy (35 weeks), delivered  Discharge Information: Date: 03/29/2013 Activity: pelvic rest Diet: routine Medications: PNV, Ibuprofen and Percocet Condition: improved Instructions: See discharge instructions. Discharge to: home Follow-up Information   Follow up with Thurnell Lose, MD. Schedule an appointment as soon as possible for a visit in 2 weeks. (incision check )    Specialty:  Obstetrics and Gynecology   Contact information:   [address], [address] [place] [address] [phone]       Newborn Data: Live born female  Birth Weight: 6 lb 8.6 oz (2965 g) APGAR: 9, 9  Home with mother.  Thurnell Lose 03/29/2013, 8:40 PM

## 2013-08-17 ENCOUNTER — Telehealth: Payer: Self-pay | Admitting: Genetic Counselor

## 2013-08-17 NOTE — Telephone Encounter (Signed)
LEFT MESSAGE FOR PATIENT TO RETURN CALL TO [facility].

## 2013-08-25 ENCOUNTER — Telehealth: Payer: Self-pay | Admitting: *Deleted

## 2013-08-25 NOTE — Telephone Encounter (Signed)
Called and left a message for pt to return my call so I can schedule a genetic appt.

## 2013-08-29 ENCOUNTER — Telehealth: Payer: Self-pay | Admitting: *Deleted

## 2013-08-29 NOTE — Telephone Encounter (Signed)
Left message for a return phone call to schedule genetic appointment.  Awaiting patient response.

## 2013-09-23 ENCOUNTER — Telehealth: Payer: Self-pay | Admitting: *Deleted

## 2013-09-23 NOTE — Telephone Encounter (Signed)
Left message for pt to return my call so I can schedule her w/ genetics.

## 2013-09-30 ENCOUNTER — Telehealth: Payer: Self-pay | Admitting: *Deleted

## 2013-09-30 NOTE — Telephone Encounter (Signed)
Called & confirmed 11/11/13 genetic appt w/ pt. Called Bobbi at referring to make her aware. Took paperwork to [facility] to give to counselor.

## 2013-11-11 ENCOUNTER — Other Ambulatory Visit: Payer: BC Managed Care – PPO

## 2013-11-11 ENCOUNTER — Ambulatory Visit (HOSPITAL_BASED_OUTPATIENT_CLINIC_OR_DEPARTMENT_OTHER): Payer: BC Managed Care – PPO | Admitting: Genetic Counselor

## 2013-11-11 DIAGNOSIS — Z808 Family history of malignant neoplasm of other organs or systems: Secondary | ICD-10-CM | POA: Insufficient documentation

## 2013-11-11 DIAGNOSIS — Z803 Family history of malignant neoplasm of breast: Secondary | ICD-10-CM

## 2013-11-11 DIAGNOSIS — IMO0002 Reserved for concepts with insufficient information to code with codable children: Secondary | ICD-10-CM

## 2013-11-11 DIAGNOSIS — Z800 Family history of malignant neoplasm of digestive organs: Secondary | ICD-10-CM | POA: Insufficient documentation

## 2013-11-11 NOTE — Progress Notes (Signed)
HISTORY OF PRESENT ILLNESS: Natalie Barker, a 28 y.o. female, was seen for a cancer genetics consultation due to a family history of cancer.  Natalie Barker presents to clinic today to discuss the possibility of a hereditary predisposition to cancer, genetic testing, and to further clarify her future cancer risks, as well as potential cancer risk for family members. Natalie Barker has no personal history of cancer.   Past Medical History  Diagnosis Date   Anemia    Asthma    Pyelonephritis     in grade school    Past Surgical History  Procedure Laterality Date   Eye surgery      contacts surgically implanted   Cesarean section N/A 02/16/2013    Procedure: CESAREAN SECTION;  Surgeon: Thurnell Lose, MD;  Location: [facility];  Service: Obstetrics;  Laterality: N/A;    History   Social History   Marital Status: Married    Spouse Name: N/A    Number of Children: N/A   Years of Education: N/A   Social History Main Topics   Smoking status: Never Smoker    Smokeless tobacco: Never Used   Alcohol Use: No   Drug Use: No   Sexual Activity: Not Currently   Other Topics Concern   Not on file   Social History Narrative   No narrative on file     FAMILY HISTORY:  During the visit, a 4-generation pedigree was obtained. Significant diagnoses include the following:  Family History  Problem Relation Age of Onset   Cancer Mother     thyroid at 20, colon ca at 33, breast ca at 63   Asthma Mother    Hypertension Father    Heart disease Father     heart attack, pacemaker   Diabetes Maternal Grandmother    Heart disease Maternal Grandmother    COPD Maternal Grandmother    Stroke Maternal Grandmother    Hypertension Maternal Grandmother    Cancer Maternal Grandmother     thyroid ca at 63   ALS Maternal Uncle    Cancer Maternal Aunt     colon ca at 89   Cancer Maternal Grandfather     colon ca at 79    Natalie Barker's ancestry is of Native American and  Caucasian descent. There is no known Jewish ancestry or consanguinity.  GENETIC COUNSELING ASSESSMENT: [name] is a 29 y.o. female with a family history of cancer suggestive of a hereditary predisposition to cancer. We, therefore, discussed and recommended the following at today's visit.   DISCUSSION: We reviewed the characteristics, features and inheritance patterns of hereditary cancer syndromes. We also discussed genetic testing, including the appropriate family members to test, the process of testing, insurance coverage and turn-around-time for results. We discussed the implications of a negative, positive and/or variant of uncertain significant result. We recommended Natalie Barker pursue genetic testing for the CancerNext gene panel which looks for mutations in several genes associated with an increased risk for cancer.   PLAN: Based on our above recommendation, Natalie Barker wished to pursue genetic testing and the blood sample was drawn and will be sent to [facility] for analysis. Results should be available within approximately 6 weeks time, at which point they will be disclosed by telephone to Ms. Fickett, as will any additional recommendations warranted by these results.   Based on Natalie Barker family history, we recommended her mother, who was diagnosed with multiple cancers at young ages, have genetic counseling and testing. We discussed  that it is always most informative to initiate genetic testing in a family member diagnosed with cancer.  Natalie Barker will speak with her mother and let Natalie Barker know if we can be of any assistance in coordinating genetic counseling and/or testing.   We also encouraged Natalie Barker to remain in contact with cancer genetics annually so that we can continuously update the family history and inform her of any changes in cancer genetics and testing that may be of benefit for this family. Ms.  Barker questions were answered to her satisfaction  today. Our contact information was provided should additional questions or concerns arise.   Thank you for the referral and allowing Natalie Barker to share in the care of your patient.   The patient was seen for a total of 40 minutes in face-to-face genetic counseling.  This patient was discussed with Magrinat who agrees with the above.    _______________________________________________________________________ For Office Staff:  Number of people involved in session: 2 Was an Intern/ student involved with case: no

## 2013-12-09 ENCOUNTER — Encounter: Payer: Self-pay | Admitting: Genetic Counselor

## 2013-12-09 DIAGNOSIS — Z800 Family history of malignant neoplasm of digestive organs: Secondary | ICD-10-CM

## 2013-12-09 DIAGNOSIS — Z808 Family history of malignant neoplasm of other organs or systems: Secondary | ICD-10-CM

## 2013-12-09 DIAGNOSIS — Z803 Family history of malignant neoplasm of breast: Secondary | ICD-10-CM

## 2013-12-09 NOTE — Progress Notes (Signed)
HPI:  Natalie Barker was previously seen in the [place] clinic due to a family history of cancer and concerns regarding a hereditary predisposition to cancer. Please refer to our prior cancer genetics clinic note for more information regarding Natalie Barker's medical, social and family histories, and our assessment and recommendations, at the time. Natalie Barker recent genetic test results were disclosed to her, as were recommendations warranted by these results. These results and recommendations are discussed in more detail below.  GENETIC TEST RESULTS: At the time of Natalie Barker's visit, we recommended she pursue genetic testing of the [facility]Next gene panel. This test, which included sequencing and deletion/duplication analysis of the genes listed on the test report, was performed at [facility]. Genetic testing was normal, and did not reveal a mutation in these genes. A complete list of all genes tested is located on the test report scanned into [employer].    We discussed with Natalie Barker that since the current genetic testing is not perfect, it is possible there may be a gene mutation in one of these genes that current testing cannot detect, but that chance is small.  We also discussed, that it is possible that another gene that has not yet been discovered, or that we have not yet tested, is responsible for the cancer diagnoses in the family, and it is, therefore, important to remain in touch with cancer genetics in the future so that we can continue to offer Natalie Barker the most up to date genetic testing.   CANCER SCREENING RECOMMENDATIONS: This normal result is reassuring and indicates that Natalie Barker does not likely have an increased risk of cancer due to a a mutation in one of these genes.  We, therefore, recommended  Natalie Barker continue to follow the cancer screening guidelines provided by her primary healthcare providers.   RECOMMENDATIONS FOR FAMILY MEMBERS:  While  these results are reassuring for Natalie Barker, this test does not tell us anything about Natalie Barker's mother's or maternal relatives' risks. We recommended further genetic testing in Natalie Barker's family as such testing might help [name] be even more confident in interpreting Natalie Barker's own results. Genetic testing is best initiated in someone who has had cancer.  In this family, her mother or maternal aunt would be the most informative individuals to test, as it is possible one of them carries a gene mutation that Natalie Barker did not inherit. Please let us know if we can help facilitate testing for anyone in this family. Genetic counselors can be located in other cities, by visiting the website of the [employer] ([employer].se) and Field seismologist for a Dietitian by zip code.  FOLLOW-UP: Lastly, we discussed with Natalie Barker that cancer genetics is a rapidly advancing field and it is possible that new genetic tests will be appropriate for her and/or her family members in the future. We encouraged her to remain in contact with cancer genetics on an annual basis so we can update her personal and family histories and let her know of advances in cancer genetics that may benefit this family.   Our contact number was provided. Natalie Barker questions were answered to her satisfaction, and she knows she is welcome to call us at anytime with additional questions or concerns. This patient was discussed with Dr. Jana Hakim who agrees with the above.   Natalie A. Fine, MS, CGC Certified Genetic Counseor phone: [phone] [email or web address]

## 2015-02-14 DIAGNOSIS — H1010 Acute atopic conjunctivitis, unspecified eye: Secondary | ICD-10-CM | POA: Insufficient documentation

## 2015-02-14 DIAGNOSIS — Z91018 Allergy to other foods: Secondary | ICD-10-CM

## 2015-02-14 DIAGNOSIS — K219 Gastro-esophageal reflux disease without esophagitis: Secondary | ICD-10-CM

## 2015-02-14 DIAGNOSIS — J45909 Unspecified asthma, uncomplicated: Secondary | ICD-10-CM | POA: Insufficient documentation

## 2015-02-14 DIAGNOSIS — R1110 Vomiting, unspecified: Secondary | ICD-10-CM | POA: Insufficient documentation

## 2015-02-14 DIAGNOSIS — J309 Allergic rhinitis, unspecified: Principal | ICD-10-CM

## 2015-05-09 IMAGING — US US OB COMP +14 WK
2 series · 12 of 28 positions shown · non-contrast
Comparison: none

[Series 1: us ob comp +14 wk · 2 of 12 slices shown (1 of 2)]
[im 4/12]
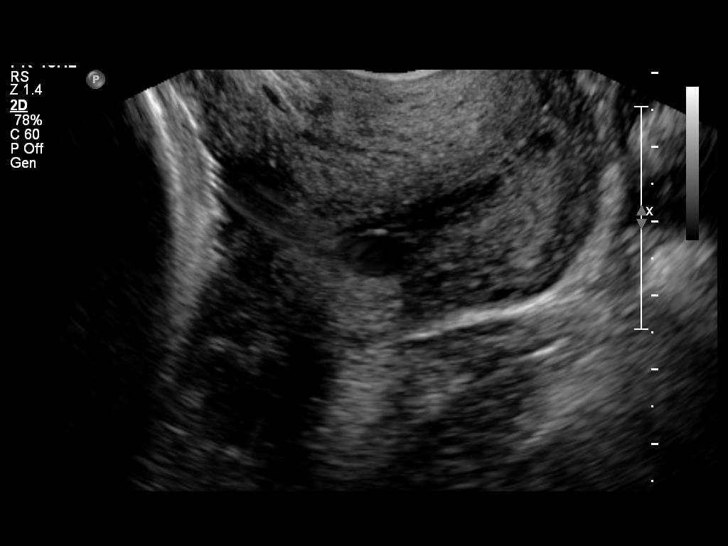
[im 12/12]
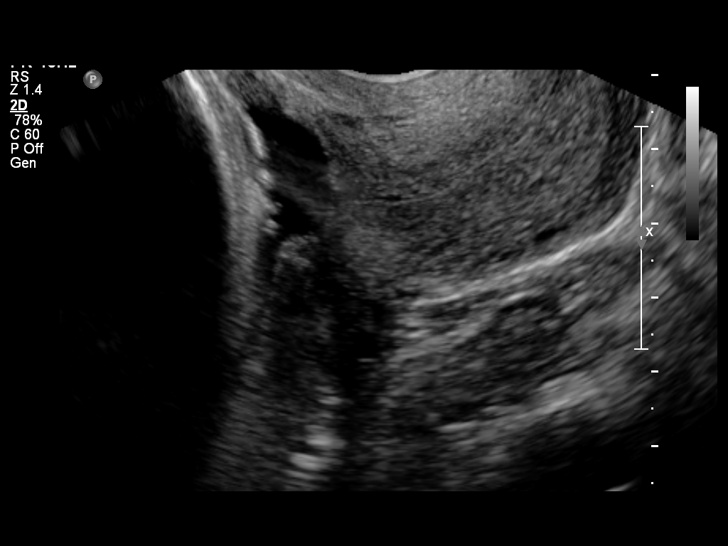

[Series 1: us ob comp +14 wk · 67 acquisitions, 10 frames shown (2 of 2)]
[im 3/67]
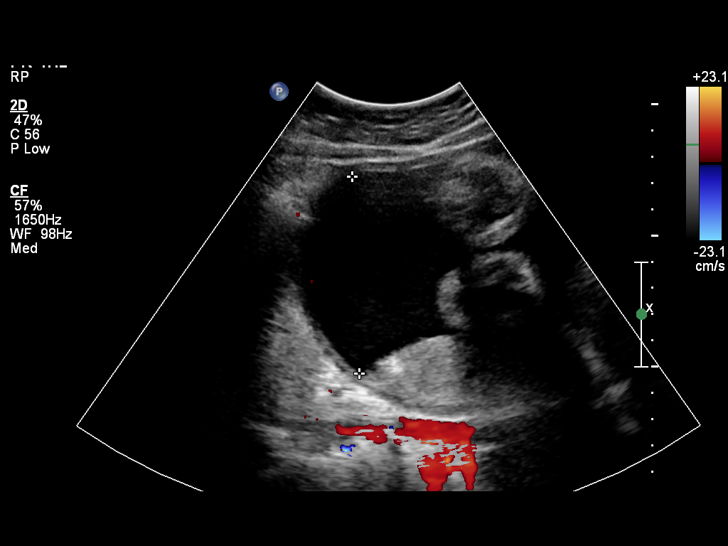
[im 12/67]
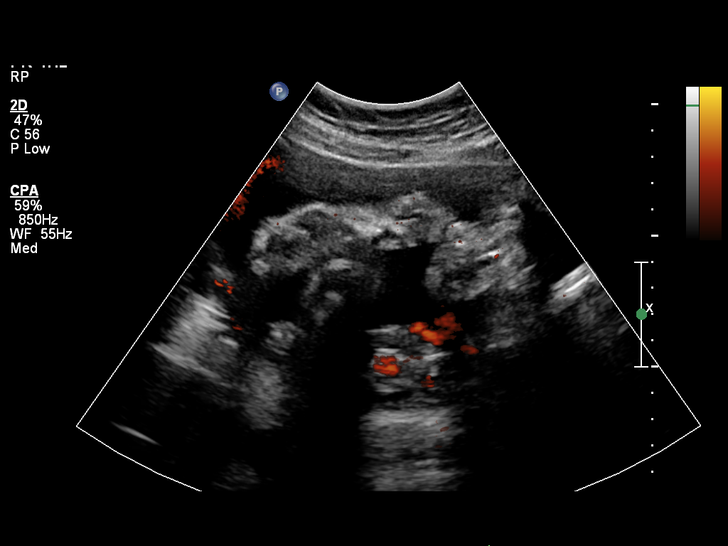
[im 18/67]
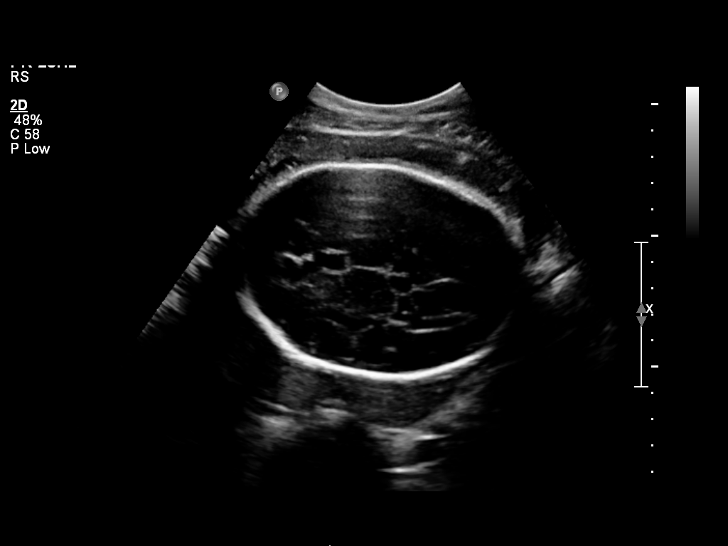
[im 23/67]
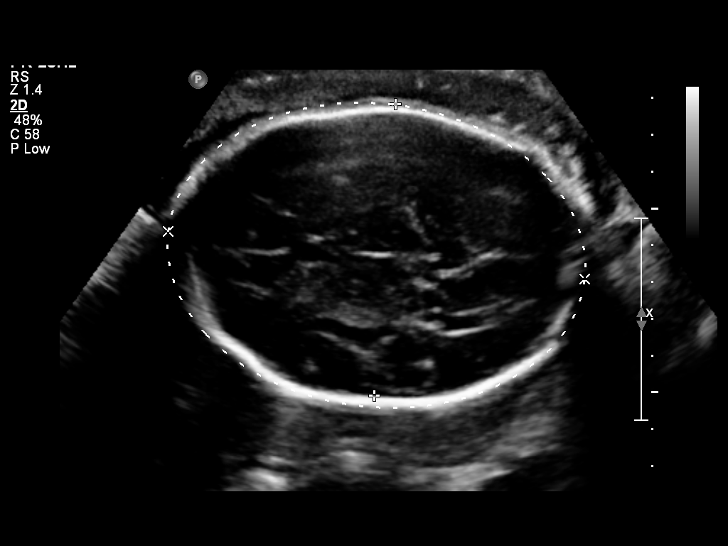
[im 32/67]
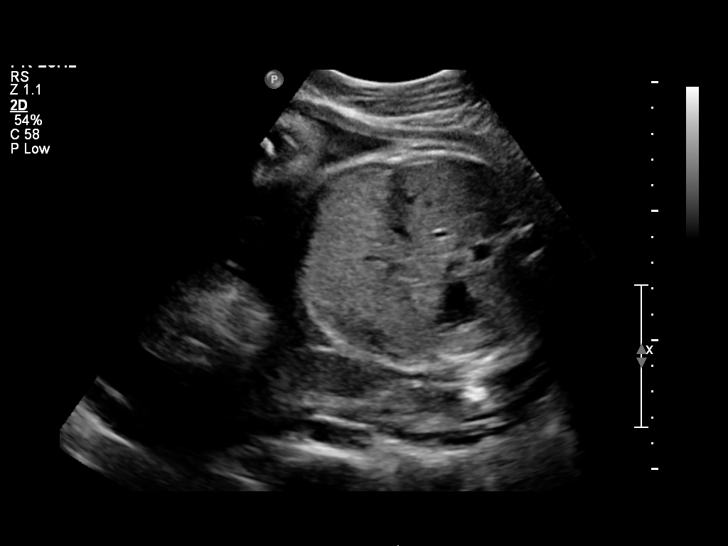
[im 38/67]
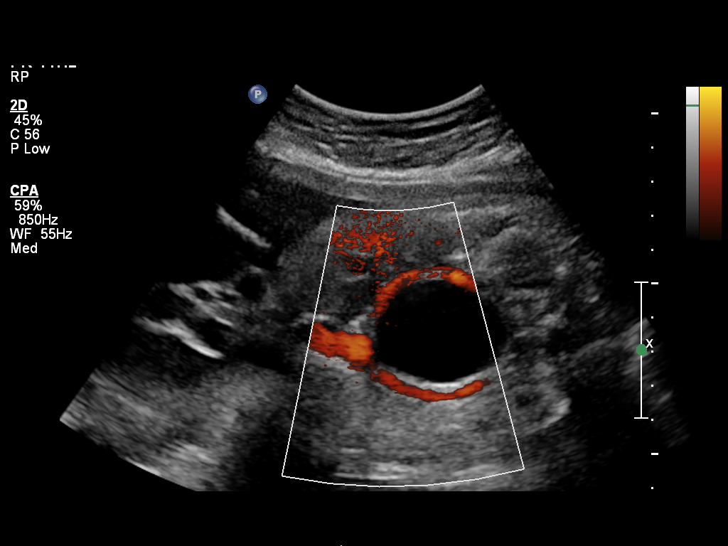
[im 44/67]
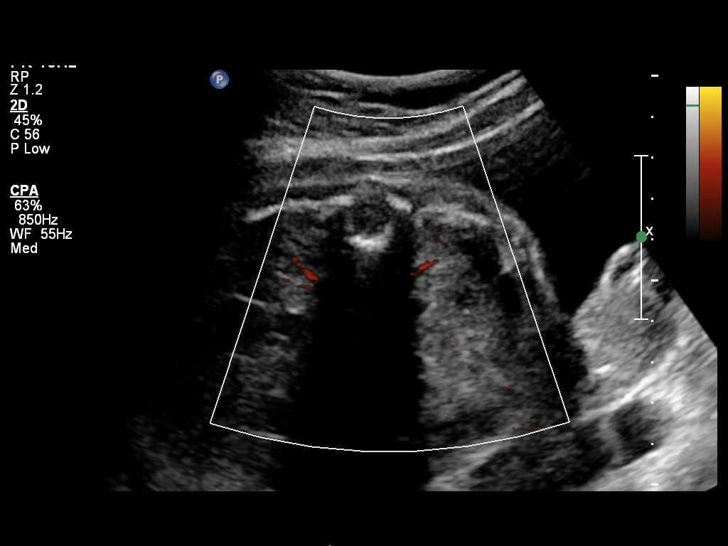
[im 52/67]
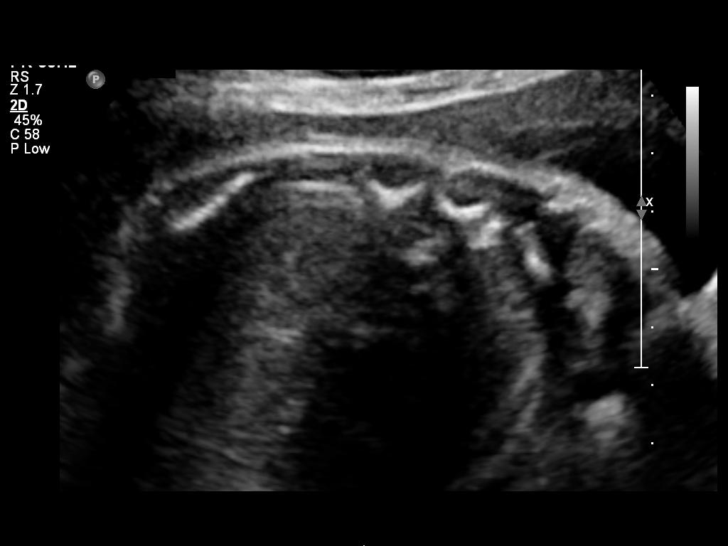
[im 58/67]
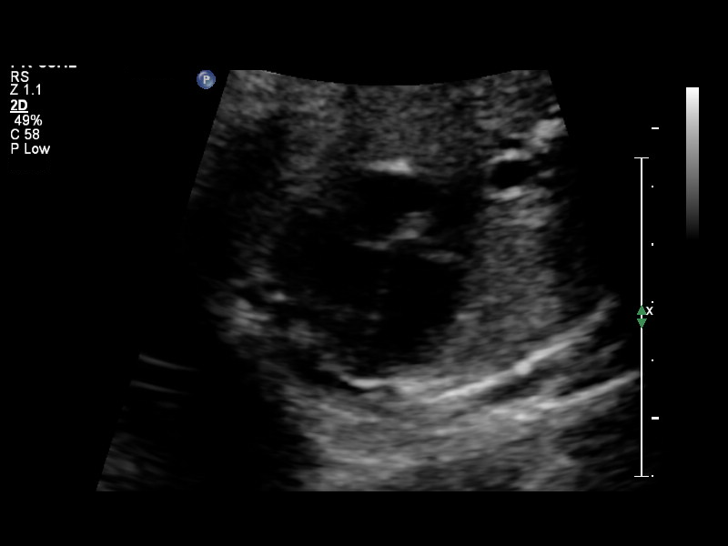
[im 64/67]
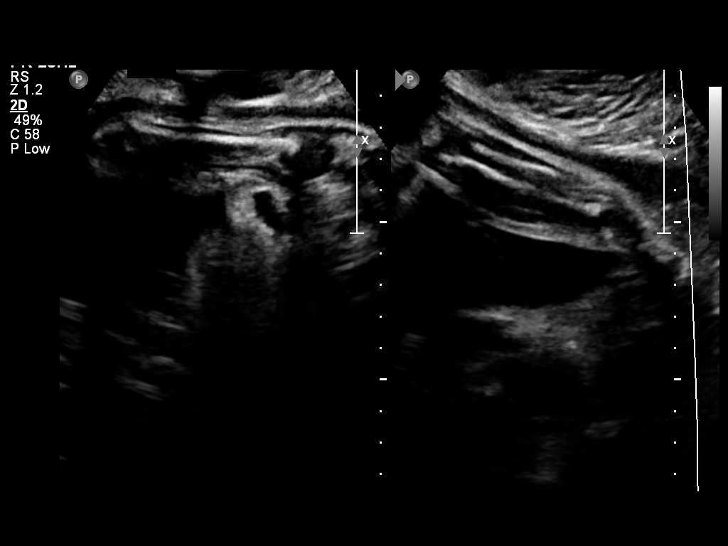

[12 of 28 positions shown; findings below may reference images not displayed]

OBSTETRICS REPORT
                      (Signed Final 01/22/2013 [DATE])

             [facility]

Service(s) Provided

 US OB COMP + 14 WK                                    76805.1
 US OB TRANSVAGINAL                                    76817.0
Indications

 Placenta previa/Low lying: Bleeding
Fetal Evaluation

 Num Of Fetuses:    1
 Fetal Heart Rate:  146                          bpm
 Cardiac Activity:  Observed
 Presentation:      Cephalic
 Placenta:          Posterior, low-lying, .5 cm
                    from int os
 P. Cord            Not well visualized
 Insertion:

 Amniotic Fluid
 AFI FV:      Subjectively within normal limits
 AFI Sum:     17.92   cm       66  %Tile     Larg Pckt:    7.48  cm
 RUQ:   7.48    cm   RLQ:    3.56   cm    LUQ:   3.86    cm   LLQ:    3.02   cm
Biometry

 BPD:     79.6  mm     G. Age:  32w 0d                CI:        67.47   70 - 86
                                                      FL/HC:      20.3   19.1 -

 HC:     310.2  mm     G. Age:  34w 4d       72  %    HC/AC:      1.01   0.96 -

 AC:     308.2  mm     G. Age:  34w 5d       96  %    FL/BPD:     79.0   71 - 87
 FL:      62.9  mm     G. Age:  32w 4d       41  %    FL/AC:      20.4   20 - 24

 Est. FW:    9963  gm      5 lb 1 oz     80  %
Gestational Age

 U/S Today:     33w 3d                                        EDD:   03/08/13
 Best:          32w 3d     Det. By:  Early Ultrasound         EDD:   03/15/13
Anatomy

 Cranium:          Appears normal         Aortic Arch:      Basic anatomy
                                                            exam per order
 Fetal Cavum:      Appears normal         Ductal Arch:      Basic anatomy
                                                            exam per order
 Ventricles:       Appears normal         Diaphragm:        Appears normal
 Choroid Plexus:   Appears normal         Stomach:          Appears normal, left
                                                            sided
 Cerebellum:       Appears normal         Abdomen:          Appears normal
 Posterior Fossa:  Appears normal         Abdominal Wall:   Not well visualized
 Nuchal Fold:      Not applicable (>20    Cord Vessels:     Appears normal (3
                   wks GA)                                  vessel cord)
 Face:             Basic anatomy          Kidneys:          Appear normal
                   exam per order
 Lips:             Appears normal         Bladder:          Appears normal
 Heart:            Appears normal         Spine:            Appears normal
                   (4CH, axis, and
                   situs)
 RVOT:             Appears normal         Lower             Appears normal
                                          Extremities:
 LVOT:             Appears normal         Upper             Not well visualized
                                          Extremities:

 Other:  Fetus appears to be a female. Technically difficult due to advanced
         GA and fetal position.
Targeted Anatomy

 Fetal Central Nervous System
 Lat. Ventricles:
Cervix Uterus Adnexa

 Cervical Length:    4.1      cm

 Cervix:       Normal appearance by transvaginal scan
 Left Ovary:    Within normal limits.
 Right Ovary:   Not visualized. No adnexal mass visualized.

 Adnexa:     No abnormality visualized.
Comments

 A basic fetal anatomy ultrasound was performed and no fetal
 anomalies were seen.  However, a detailed fetal anatomic
 survey was not completed, if a more detailed fetal
 assessment is desired an additional ultrasound should be
 ordered.

 The placental edge is patially over the posterior aspect of the
 cervix and measures 0.5 cm from the internal os.
Impression

 Single living intrauterine pregnancy at 32 weeks 3 days.
 Appropriate fetal growth (80%).
 Normal amniotic fluid volume.
 No gross fetal anomalies identified.
 Marginal posterior placental previa.
Recommendations

 Follow-up ultrasounds as clinically indicated.
                Kasali, Derbie

## 2016-02-21 LAB — OB RESULTS CONSOLE GC/CHLAMYDIA
Chlamydia: NEGATIVE
Gonorrhea: NEGATIVE

## 2016-02-21 LAB — OB RESULTS CONSOLE HEPATITIS B SURFACE ANTIGEN: HEP B S AG: NEGATIVE

## 2016-02-21 LAB — OB RESULTS CONSOLE RPR: RPR: NONREACTIVE

## 2016-02-21 LAB — OB RESULTS CONSOLE ABO/RH: RH Type: POSITIVE

## 2016-02-21 LAB — OB RESULTS CONSOLE ANTIBODY SCREEN: ANTIBODY SCREEN: NEGATIVE

## 2016-02-21 LAB — OB RESULTS CONSOLE HIV ANTIBODY (ROUTINE TESTING): HIV: NONREACTIVE

## 2016-02-21 LAB — OB RESULTS CONSOLE RUBELLA ANTIBODY, IGM: RUBELLA: IMMUNE

## 2016-03-05 ENCOUNTER — Other Ambulatory Visit (HOSPITAL_COMMUNITY)
Admission: RE | Admit: 2016-03-05 | Discharge: 2016-03-05 | Disposition: A | Discharge status: Home / Self Care | Payer: BC Managed Care – PPO | Source: Ambulatory Visit | Attending: Obstetrics and Gynecology | Admitting: Obstetrics and Gynecology

## 2016-03-05 ENCOUNTER — Other Ambulatory Visit: Payer: Self-pay | Admitting: Obstetrics and Gynecology

## 2016-03-05 DIAGNOSIS — Z1151 Encounter for screening for human papillomavirus (HPV): Secondary | ICD-10-CM | POA: Insufficient documentation

## 2016-03-05 DIAGNOSIS — Z01419 Encounter for gynecological examination (general) (routine) without abnormal findings: Secondary | ICD-10-CM | POA: Diagnosis present

## 2016-03-07 LAB — CYTOLOGY - PAP

## 2016-06-10 ENCOUNTER — Inpatient Hospital Stay (HOSPITAL_COMMUNITY)
Admission: AD | Admit: 2016-06-10 | Discharge: 2016-06-10 | Disposition: A | Discharge status: Home / Self Care | Payer: BC Managed Care – PPO | Source: Ambulatory Visit | Attending: Obstetrics and Gynecology | Admitting: Obstetrics and Gynecology

## 2016-06-10 ENCOUNTER — Encounter (HOSPITAL_COMMUNITY): Payer: Self-pay | Admitting: *Deleted

## 2016-06-10 DIAGNOSIS — O9989 Other specified diseases and conditions complicating pregnancy, childbirth and the puerperium: Secondary | ICD-10-CM | POA: Diagnosis not present

## 2016-06-10 DIAGNOSIS — Z880 Allergy status to penicillin: Secondary | ICD-10-CM | POA: Insufficient documentation

## 2016-06-10 DIAGNOSIS — O99612 Diseases of the digestive system complicating pregnancy, second trimester: Secondary | ICD-10-CM | POA: Insufficient documentation

## 2016-06-10 DIAGNOSIS — A084 Viral intestinal infection, unspecified: Secondary | ICD-10-CM | POA: Insufficient documentation

## 2016-06-10 DIAGNOSIS — O210 Mild hyperemesis gravidarum: Secondary | ICD-10-CM | POA: Diagnosis present

## 2016-06-10 DIAGNOSIS — Z3A24 24 weeks gestation of pregnancy: Secondary | ICD-10-CM | POA: Diagnosis not present

## 2016-06-10 LAB — COMPREHENSIVE METABOLIC PANEL
ALT: 7 U/L — AB (ref 14–54)
AST: 19 U/L (ref 15–41)
Albumin: 3.2 g/dL — ABNORMAL LOW (ref 3.5–5.0)
Alkaline Phosphatase: 77 U/L (ref 38–126)
Anion gap: 7 (ref 5–15)
BUN: 11 mg/dL (ref 6–20)
CALCIUM: 8.5 mg/dL — AB (ref 8.9–10.3)
CO2: 21 mmol/L — AB (ref 22–32)
CREATININE: 0.56 mg/dL (ref 0.44–1.00)
Chloride: 108 mmol/L (ref 101–111)
GFR calc Af Amer: >60 mL/min (ref >60)
Glucose, Bld: 89 mg/dL (ref 65–99)
Potassium: 3.7 mmol/L (ref 3.5–5.1)
SODIUM: 136 mmol/L (ref 135–145)
Total Bilirubin: 0.5 mg/dL (ref 0.3–1.2)
Total Protein: 6.3 g/dL — ABNORMAL LOW (ref 6.5–8.1)

## 2016-06-10 LAB — RAPID STREP SCREEN (MED CTR MEBANE ONLY): Streptococcus, Group A Screen (Direct): NEGATIVE

## 2016-06-10 LAB — CBC
HCT: 35.2 % — ABNORMAL LOW (ref 36.0–46.0)
HEMOGLOBIN: 12 g/dL (ref 12.0–15.0)
MCH: 29.5 pg (ref 26.0–34.0)
MCHC: 34.1 g/dL (ref 30.0–36.0)
MCV: 86.5 fL (ref 78.0–100.0)
Platelets: 280 10*3/uL (ref 150–400)
RBC: 4.07 MIL/uL (ref 3.87–5.11)
RDW: 14.4 % (ref 11.5–15.5)
WBC: 18.2 10*3/uL — ABNORMAL HIGH (ref 4.0–10.5)

## 2016-06-10 LAB — URINALYSIS, ROUTINE W REFLEX MICROSCOPIC
BILIRUBIN URINE: NEGATIVE
Glucose, UA: NEGATIVE mg/dL
Hgb urine dipstick: NEGATIVE
Ketones, ur: 80 mg/dL — AB
Leukocytes, UA: NEGATIVE
Nitrite: NEGATIVE
Protein, ur: 30 mg/dL — AB
Specific Gravity, Urine: 1.029 (ref 1.005–1.030)
pH: 5 (ref 5.0–8.0)

## 2016-06-10 LAB — INFLUENZA PANEL BY PCR (TYPE A & B)
Influenza A By PCR: NEGATIVE
Influenza B By PCR: NEGATIVE

## 2016-06-10 MED ORDER — SODIUM CHLORIDE 0.9 % IV SOLN
8.0000 mg | Freq: Once | INTRAVENOUS | Status: AC
  Administered 2016-06-10: 8 mg via INTRAVENOUS
  Filled 2016-06-10: qty 4

## 2016-06-10 MED ORDER — DEXTROSE 5 % IN LACTATED RINGERS IV BOLUS
1000.0000 mL | Freq: Once | INTRAVENOUS | Status: AC
  Administered 2016-06-10: 1000 mL via INTRAVENOUS

## 2016-06-10 MED ORDER — ONDANSETRON 4 MG PO TBDP: 4.0000 mg | ORAL_TABLET | Freq: Three times a day (TID) | ORAL | 0 refills | Status: DC | PRN

## 2016-06-10 MED ORDER — LACTATED RINGERS IV BOLUS (SEPSIS)
1000.0000 mL | Freq: Once | INTRAVENOUS | Status: AC
Start: 2016-06-10 — End: 2016-06-10
  Administered 2016-06-10: 1000 mL via INTRAVENOUS

## 2016-06-10 MED ORDER — FAMOTIDINE IN NACL 20-0.9 MG/50ML-% IV SOLN
20.0000 mg | Freq: Once | INTRAVENOUS | Status: AC
  Administered 2016-06-10: 20 mg via INTRAVENOUS
  Filled 2016-06-10: qty 50

## 2016-06-10 NOTE — Progress Notes (Signed)
Written and verbal d/c instructions given and understanding voiced. 

## 2016-06-10 NOTE — Progress Notes (Signed)
Efm removed by Robyne Askew NP

## 2016-06-10 NOTE — MAU Note (Signed)
Had diarrhea all day yesterday.  Started vomiting at 0400.  Had a fever of 101 this a.m.  Pain in upper abd.

## 2016-06-10 NOTE — MAU Provider Note (Signed)
History     CSN: [id number]  Arrival date and time: 06/10/16 1101   First Provider Initiated Contact with Patient 06/10/16 1155      Chief Complaint  Patient presents with  . Emesis  . Diarrhea   HPI  Natalie Barker is a 31 y.o. G2P0101 at 80w0dwho presents with n/v/d and fever. Symptoms began yesterday. Reports 4 episodes of loose/watery stool yesterday. Has not had BM today. Vomiting began this morning. Has vomited 9 times. Only had diclegis at home and did not take that. Had fever at 9 am of 101; did not take anything to treat the fever. Some epigastric pain that she describes as soreness. Rates pain 4/10. Vomiting makes pain worse.  Some throat pain that she relates to vomiting. Also reports cough since the beginning of pregnancy that she was told was related to allergies.  Denies vaginal bleeding, lower abdominal pain, LOF, dysuria, ear pain, body aches or headache.  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  Positive fetal movement.   OB History    Gravida Para Term Preterm AB Living   2 1   1   1    SAB TAB Ectopic Multiple Live Births           1      Past Medical History:  Diagnosis Date  . Anemia   . Asthma   . Pyelonephritis    in grade school    Past Surgical History:  Procedure Laterality Date  . CESAREAN SECTION N/A 02/16/2013   Procedure: CESAREAN SECTION;  Surgeon: EThurnell Lose MD;  [facility];  Service: Obstetrics;  Laterality: N/A;  . EYE SURGERY     contacts surgically implanted    Family History  Problem Relation Age of Onset  . ALS Maternal Uncle   . Cancer Mother     thyroid at 487 colon ca at 43 breast ca at 89 . Asthma Mother   . Hypertension Father   . Heart disease Father     heart attack, pacemaker  . Diabetes Maternal Grandmother   . Heart disease Maternal Grandmother   . COPD Maternal Grandmother   . Stroke Maternal Grandmother   . Hypertension Maternal Grandmother   . Cancer Maternal  Grandmother     thyroid ca at 643 . Cancer Maternal Aunt     colon ca at 463 . Cancer Maternal Grandfather     colon ca at 773   Social History  Substance Use Topics  . Smoking status: Never Smoker  . Smokeless tobacco: Never Used  . Alcohol use No    Allergies:  Allergies  Allergen Reactions  . Penicillins Hives and Shortness Of Breath    Has patient had a PCN reaction causing immediate rash, facial/tongue/throat swelling, SOB or lightheadedness with hypotension: yes Has patient had a PCN reaction causing severe rash involving mucus membranes or skin necrosis: yes Has patient had a PCN reaction that required hospitalization yes Has patient had a PCN reaction occurring within the last 10 years: no If all of the above answers are "NO", then may proceed with Cephalosporin use.   . Shellfish Allergy Anaphylaxis  . Bactroban [Mupirocin Calcium] Hives  . Iodine Other (See Comments)    Patient has a severe shellfish allergy.    Prescriptions Prior to Admission  Medication Sig Dispense Refill Last Dose  . acetaminophen (TYLENOL) 325 MG tablet Take 325 mg by mouth every 6 (  six) hours as needed for pain (For headache.).   06/10/2016 at 0800  . albuterol (PROVENTIL HFA;VENTOLIN HFA) 108 (90 BASE) MCG/ACT inhaler Inhale 1 puff into the lungs every 6 (six) hours as needed for wheezing or shortness of breath.   06/10/2016 at Unknown time  . Prenatal Vit-Fe Fumarate-FA (PRENATAL MULTIVITAMIN) TABS tablet Take 1 tablet by mouth daily at 12 noon.   06/09/2016 at Unknown time  . budesonide (PULMICORT) 180 MCG/ACT inhaler Inhale 2 puffs into the lungs daily.     . budesonide (RHINOCORT ALLERGY) 32 MCG/ACT nasal spray Place 1 spray into both nostrils 3 (three) times a week.     . calcium carbonate (TUMS - DOSED IN MG ELEMENTAL CALCIUM) 500 MG chewable tablet Chew 1 tablet by mouth daily as needed for heartburn.   02/02/2013 at Unknown  . EPINEPHrine (EPIPEN 2-PAK) 0.3 mg/0.3 mL IJ SOAJ injection  Inject 0.3 mg into the muscle once.     . FeFum-FePoly-FA-B Cmp-C-Biot (INTEGRA PLUS) CAPS Take 1 capsule by mouth at bedtime.   02/01/2013  . ibuprofen (ADVIL,MOTRIN) 800 MG tablet Take 1 tablet (800 mg total) by mouth every 8 (eight) hours as needed for pain. 50 tablet 1   . montelukast (SINGULAIR) 10 MG tablet Take 10 mg by mouth daily.     . norethindrone (ORTHO MICRONOR) 0.35 MG tablet Take 1 tablet (0.35 mg total) by mouth daily. 1 Package 11   . omeprazole (PRILOSEC) 20 MG capsule Take 20 mg by mouth daily.     . ondansetron (ZOFRAN-ODT) 4 MG disintegrating tablet Take 4 mg by mouth every 8 (eight) hours as needed for nausea.   a while ago  . oxyCODONE-acetaminophen (ROXICET) 5-325 MG per tablet Take 1 tablet by mouth every 4 (four) hours as needed for pain. 40 tablet 0   . polyethylene glycol (MIRALAX / GLYCOLAX) packet Take 17 g by mouth daily. 14 each 6     Review of Systems  Constitutional: Positive for fever. Negative for chills.  HENT: Positive for sore throat. Negative for ear pain.   Respiratory: Positive for cough. Negative for shortness of breath and wheezing.   Gastrointestinal: Positive for abdominal pain, diarrhea, nausea and vomiting. Negative for constipation.  Genitourinary: Negative.   Musculoskeletal: Negative for myalgias.   Physical Exam   Blood pressure (!) 97/45, pulse 108, temperature 98.6 F (37 C), resp. rate 16, height 5' 9"  (1.753 m), weight 180 lb (81.6 kg), unknown if currently breastfeeding.  Physical Exam  Nursing note and vitals reviewed. Constitutional: She is oriented to person, place, and time. She appears well-developed and well-nourished. No distress.  HENT:  Head: Normocephalic and atraumatic.  Eyes: Conjunctivae are normal. Right eye exhibits no discharge. Left eye exhibits no discharge. No scleral icterus.  Neck: Normal range of motion.  Cardiovascular: Regular rhythm and normal heart sounds.  Tachycardia present.   No murmur  heard. Respiratory: Effort normal and breath sounds normal. No respiratory distress. She has no wheezes.  GI: Soft. Bowel sounds are normal. There is no tenderness. There is no rebound and no guarding.  Neurological: She is alert and oriented to person, place, and time.  Skin: Skin is warm and dry. She is not diaphoretic.  Psychiatric: She has a normal mood and affect. Her behavior is normal. Judgment and thought content normal.   Fetal Tracing:  Baseline: 150 Variability: moderate Accelerations: none Decelerations: none  Toco: none  MAU Course  Procedures Results for orders placed or performed during the hospital  encounter of 06/10/16 (from the past 24 hour(s))  Urinalysis, Routine w reflex microscopic     Status: Abnormal   Collection Time: 06/10/16 11:55 AM  Result Value Ref Range   Color, Urine YELLOW YELLOW   APPearance CLOUDY (A) CLEAR   Specific Gravity, Urine 1.029 1.005 - 1.030   pH 5.0 5.0 - 8.0   Glucose, UA NEGATIVE NEGATIVE mg/dL   Hgb urine dipstick NEGATIVE NEGATIVE   Bilirubin Urine NEGATIVE NEGATIVE   Ketones, ur 80 (A) NEGATIVE mg/dL   Protein, ur 30 (A) NEGATIVE mg/dL   Nitrite NEGATIVE NEGATIVE   Leukocytes, UA NEGATIVE NEGATIVE   RBC / HPF 0-5 0 - 5 RBC/hpf   WBC, UA 0-5 0 - 5 WBC/hpf   Bacteria, UA RARE (A) NONE SEEN   Squamous Epithelial / LPF 6-30 (A) NONE SEEN   Mucous PRESENT   Rapid strep screen (not at [facility])     Status: None   Collection Time: 06/10/16 12:32 PM  Result Value Ref Range   Streptococcus, Group A Screen (Direct) NEGATIVE NEGATIVE  Influenza panel by PCR (type A & B)     Status: None   Collection Time: 06/10/16 12:32 PM  Result Value Ref Range   Influenza A By PCR NEGATIVE NEGATIVE   Influenza B By PCR NEGATIVE NEGATIVE  CBC     Status: Abnormal   Collection Time: 06/10/16 12:50 PM  Result Value Ref Range   WBC 18.2 (H) 4.0 - 10.5 K/uL   RBC 4.07 3.87 - 5.11 MIL/uL   Hemoglobin 12.0 12.0 - 15.0 g/dL   HCT 35.2 (L) 36.0 -  46.0 %   MCV 86.5 78.0 - 100.0 fL   MCH 29.5 26.0 - 34.0 pg   MCHC 34.1 30.0 - 36.0 g/dL   RDW 14.4 11.5 - 15.5 %   Platelets 280 150 - 400 K/uL  Comprehensive metabolic panel     Status: Abnormal   Collection Time: 06/10/16 12:50 PM  Result Value Ref Range   Sodium 136 135 - 145 mmol/L   Potassium 3.7 3.5 - 5.1 mmol/L   Chloride 108 101 - 111 mmol/L   CO2 21 (L) 22 - 32 mmol/L   Glucose, Bld 89 65 - 99 mg/dL   BUN 11 6 - 20 mg/dL   Creatinine, Ser 0.56 0.44 - 1.00 mg/dL   Calcium 8.5 (L) 8.9 - 10.3 mg/dL   Total Protein 6.3 (L) 6.5 - 8.1 g/dL   Albumin 3.2 (L) 3.5 - 5.0 g/dL   AST 19 15 - 41 U/L   ALT 7 (L) 14 - 54 U/L   Alkaline Phosphatase 77 38 - 126 U/L   Total Bilirubin 0.5 0.3 - 1.2 mg/dL   GFR calc non Af Amer >60 >60 mL/min   GFR calc Af Amer >60 >60 mL/min   Anion gap 7 5 - 15    MDM Fetal tracing appropriate for gestation; no contractions IV fluids -- LR followed by bag of D5LR zofran 8 mg IV pepcid 20 mg IV Flu & strep swabs -- flu negative CBC & CMP Pt reports improvement in symptoms & able to tolerates POs D/w Dr. Nelda Marseille. Ok to Discharge home.  Assessment and Plan  A: 1. Viral gastroenteritis    P: Discharge home Rx zofran Advance diet as tolerated  Good hand hygiene Keep f/u with OB Discussed reasons to return to [place] 06/10/2016, 11:55 AM

## 2016-06-10 NOTE — Discharge Instructions (Signed)
Viral Gastroenteritis, Adult Viral gastroenteritis is also known as the stomach flu. This condition is caused by various viruses. These viruses can be passed from person to person very easily (are very contagious). This condition may affect your stomach, small intestine, and large intestine. It can cause sudden watery diarrhea, fever, and vomiting. Diarrhea and vomiting can make you feel weak and cause you to become dehydrated. You may not be able to keep fluids down. Dehydration can make you tired and thirsty, cause you to have a dry mouth, and decrease how often you urinate. Older adults and people with other diseases or a weak immune system are at higher risk for dehydration. It is important to replace the fluids that you lose from diarrhea and vomiting. If you become severely dehydrated, you may need to get fluids through an IV tube. What are the causes? Gastroenteritis is caused by various viruses, including rotavirus and norovirus. Norovirus is the most common cause in adults. You can get sick by eating food, drinking water, or touching a surface contaminated with one of these viruses. You can also get sick from sharing utensils or other personal items with an infected person. What increases the risk? This condition is more likely to develop in people:  Who have a weak defense system (immune system).  Who live with one or more children who are younger than 18 years old.  Who live in a nursing home.  Who go on cruise ships. What are the signs or symptoms? Symptoms of this condition start suddenly 1-2 days after exposure to a virus. Symptoms may last a few days or as long as a week. The most common symptoms are watery diarrhea and vomiting. Other symptoms include:  Fever.  Headache.  Fatigue.  Pain in the abdomen.  Chills.  Weakness.  Nausea.  Muscle aches.  Loss of appetite. How is this diagnosed? This condition is diagnosed with a medical history and physical exam. You may  also have a stool test to check for viruses or other infections. How is this treated? This condition typically goes away on its own. The focus of treatment is to restore lost fluids (rehydration). Your health care provider may recommend that you take an oral rehydration solution (ORS) to replace important salts and minerals (electrolytes) in your body. Severe cases of this condition may require giving fluids through an IV tube. Treatment may also include medicine to help with your symptoms. Follow these instructions at home: Follow instructions from your health care provider about how to care for yourself at home. Eating and drinking Follow these recommendations as told by your health care provider:  Take an ORS. This is a drink that is sold at pharmacies and retail stores.  Drink clear fluids in small amounts as you are able. Clear fluids include water, ice chips, diluted fruit juice, and low-calorie sports drinks.  Eat bland, easy-to-digest foods in small amounts as you are able. These foods include bananas, applesauce, rice, lean meats, toast, and crackers.  Avoid fluids that contain a lot of sugar or caffeine, such as energy drinks, sports drinks, and soda.  Avoid alcohol.  Avoid spicy or fatty foods. General instructions  Drink enough fluid to keep your urine clear or pale yellow.  Wash your hands often. If soap and water are not available, use hand sanitizer.  Make sure that all people in your household wash their hands well and often.  Take over-the-counter and prescription medicines only as told by your health care provider.  Rest  at home while you recover.  Watch your condition for any changes.  Take a warm bath to relieve any burning or pain from frequent diarrhea episodes.  Keep all follow-up visits as told by your health care provider. This is important. Contact a health care provider if:  You cannot keep fluids down.  Your symptoms get worse.  You have new  symptoms.  You feel light-headed or dizzy.  You have muscle cramps. Get help right away if:  You have chest pain.  You feel extremely weak or you faint.  You see blood in your vomit.  Your vomit looks like coffee grounds.  You have bloody or black stools or stools that look like tar.  You have a severe headache, a stiff neck, or both.  You have a rash.  You have severe pain, cramping, or bloating in your abdomen.  You have trouble breathing or you are breathing very quickly.  Your heart is beating very quickly.  Your skin feels cold and clammy.  You feel confused.  You have pain when you urinate.  You have signs of dehydration, such as:  Dark urine, very little urine, or no urine.  Cracked lips.  Dry mouth.  Sunken eyes.  Sleepiness.  Weakness. This information is not intended to replace advice given to you by your health care provider. Make sure you discuss any questions you have with your health care provider. Document Released: 05/12/2005 Document Revised: 10/24/2015 Document Reviewed: 01/16/2015 Elsevier Interactive Patient Education  2017 [employer].

## 2016-06-13 LAB — CULTURE, GROUP A STREP (THRC)

## 2016-09-13 ENCOUNTER — Encounter (HOSPITAL_COMMUNITY): Payer: Self-pay

## 2016-09-13 ENCOUNTER — Inpatient Hospital Stay (HOSPITAL_COMMUNITY)
Admission: AD | Admit: 2016-09-13 | Discharge: 2016-09-13 | Disposition: A | Discharge status: Home / Self Care | Payer: BC Managed Care – PPO | Source: Ambulatory Visit | Attending: Obstetrics and Gynecology | Admitting: Obstetrics and Gynecology

## 2016-09-13 DIAGNOSIS — J029 Acute pharyngitis, unspecified: Secondary | ICD-10-CM | POA: Insufficient documentation

## 2016-09-13 DIAGNOSIS — Z803 Family history of malignant neoplasm of breast: Secondary | ICD-10-CM | POA: Insufficient documentation

## 2016-09-13 DIAGNOSIS — Z820 Family history of epilepsy and other diseases of the nervous system: Secondary | ICD-10-CM | POA: Insufficient documentation

## 2016-09-13 DIAGNOSIS — R509 Fever, unspecified: Secondary | ICD-10-CM | POA: Insufficient documentation

## 2016-09-13 DIAGNOSIS — Z880 Allergy status to penicillin: Secondary | ICD-10-CM | POA: Diagnosis not present

## 2016-09-13 DIAGNOSIS — O9989 Other specified diseases and conditions complicating pregnancy, childbirth and the puerperium: Secondary | ICD-10-CM

## 2016-09-13 DIAGNOSIS — Z8249 Family history of ischemic heart disease and other diseases of the circulatory system: Secondary | ICD-10-CM | POA: Insufficient documentation

## 2016-09-13 DIAGNOSIS — Z800 Family history of malignant neoplasm of digestive organs: Secondary | ICD-10-CM | POA: Diagnosis not present

## 2016-09-13 DIAGNOSIS — D649 Anemia, unspecified: Secondary | ICD-10-CM | POA: Diagnosis not present

## 2016-09-13 DIAGNOSIS — Z91013 Allergy to seafood: Secondary | ICD-10-CM | POA: Insufficient documentation

## 2016-09-13 DIAGNOSIS — Z823 Family history of stroke: Secondary | ICD-10-CM | POA: Insufficient documentation

## 2016-09-13 DIAGNOSIS — J45909 Unspecified asthma, uncomplicated: Secondary | ICD-10-CM | POA: Insufficient documentation

## 2016-09-13 DIAGNOSIS — O99513 Diseases of the respiratory system complicating pregnancy, third trimester: Secondary | ICD-10-CM | POA: Insufficient documentation

## 2016-09-13 DIAGNOSIS — Z888 Allergy status to other drugs, medicaments and biological substances status: Secondary | ICD-10-CM | POA: Diagnosis not present

## 2016-09-13 DIAGNOSIS — O99013 Anemia complicating pregnancy, third trimester: Secondary | ICD-10-CM | POA: Diagnosis not present

## 2016-09-13 DIAGNOSIS — Z825 Family history of asthma and other chronic lower respiratory diseases: Secondary | ICD-10-CM | POA: Insufficient documentation

## 2016-09-13 DIAGNOSIS — R51 Headache: Secondary | ICD-10-CM | POA: Insufficient documentation

## 2016-09-13 DIAGNOSIS — Z3A37 37 weeks gestation of pregnancy: Secondary | ICD-10-CM | POA: Diagnosis not present

## 2016-09-13 DIAGNOSIS — O26893 Other specified pregnancy related conditions, third trimester: Secondary | ICD-10-CM | POA: Diagnosis not present

## 2016-09-13 DIAGNOSIS — R6889 Other general symptoms and signs: Secondary | ICD-10-CM

## 2016-09-13 LAB — URINALYSIS, ROUTINE W REFLEX MICROSCOPIC
Bilirubin Urine: NEGATIVE
GLUCOSE, UA: NEGATIVE mg/dL
HGB URINE DIPSTICK: NEGATIVE
Ketones, ur: 20 mg/dL — AB
LEUKOCYTES UA: NEGATIVE
NITRITE: NEGATIVE
Protein, ur: NEGATIVE mg/dL
Specific Gravity, Urine: 1.014 (ref 1.005–1.030)
pH: 7 (ref 5.0–8.0)

## 2016-09-13 LAB — CBC WITH DIFFERENTIAL/PLATELET
BASOS ABS: 0 10*3/uL (ref 0.0–0.1)
BASOS PCT: 0 %
EOS ABS: 0.1 10*3/uL (ref 0.0–0.7)
EOS PCT: 1 %
HCT: 31.4 % — ABNORMAL LOW (ref 36.0–46.0)
Hemoglobin: 10.5 g/dL — ABNORMAL LOW (ref 12.0–15.0)
Lymphocytes Relative: 6 %
Lymphs Abs: 0.8 10*3/uL (ref 0.7–4.0)
MCH: 28.7 pg (ref 26.0–34.0)
MCHC: 33.4 g/dL (ref 30.0–36.0)
MCV: 85.8 fL (ref 78.0–100.0)
Monocytes Absolute: 0.5 10*3/uL (ref 0.1–1.0)
Monocytes Relative: 4 %
Neutro Abs: 12.3 10*3/uL — ABNORMAL HIGH (ref 1.7–7.7)
Neutrophils Relative %: 89 %
PLATELETS: 176 10*3/uL (ref 150–400)
RBC: 3.66 MIL/uL — AB (ref 3.87–5.11)
RDW: 13.8 % (ref 11.5–15.5)
WBC: 13.7 10*3/uL — AB (ref 4.0–10.5)

## 2016-09-13 LAB — INFLUENZA PANEL BY PCR (TYPE A & B)
INFLAPCR: NEGATIVE
INFLBPCR: NEGATIVE

## 2016-09-13 MED ORDER — DEXAMETHASONE SODIUM PHOSPHATE 10 MG/ML IJ SOLN
10.0000 mg | Freq: Once | INTRAMUSCULAR | Status: AC
  Administered 2016-09-13: 10 mg via INTRAVENOUS
  Filled 2016-09-13: qty 1

## 2016-09-13 MED ORDER — LACTATED RINGERS IV BOLUS (SEPSIS): 1000.0000 mL | Freq: Once | INTRAVENOUS | Status: DC

## 2016-09-13 MED ORDER — LACTATED RINGERS IV BOLUS (SEPSIS)
1000.0000 mL | Freq: Once | INTRAVENOUS | Status: AC
  Administered 2016-09-13: 1000 mL via INTRAVENOUS

## 2016-09-13 MED ORDER — OSELTAMIVIR PHOSPHATE 75 MG PO CAPS
75.0000 mg | ORAL_CAPSULE | Freq: Once | ORAL | Status: AC
  Administered 2016-09-13: 75 mg via ORAL
  Filled 2016-09-13: qty 1

## 2016-09-13 MED ORDER — OSELTAMIVIR PHOSPHATE 75 MG PO CAPS: 75.0000 mg | ORAL_CAPSULE | Freq: Two times a day (BID) | ORAL | 0 refills | Status: DC

## 2016-09-13 MED ORDER — DIPHENHYDRAMINE HCL 50 MG/ML IJ SOLN: 12.5000 mg | Freq: Once | INTRAMUSCULAR | Status: DC

## 2016-09-13 MED ORDER — METOCLOPRAMIDE HCL 5 MG/ML IJ SOLN: 10.0000 mg | Freq: Once | INTRAMUSCULAR | Status: DC

## 2016-09-13 MED ORDER — DIPHENHYDRAMINE HCL 50 MG/ML IJ SOLN
12.5000 mg | Freq: Once | INTRAMUSCULAR | Status: AC
  Administered 2016-09-13: 12.5 mg via INTRAVENOUS
  Filled 2016-09-13: qty 1

## 2016-09-13 MED ORDER — DEXAMETHASONE SODIUM PHOSPHATE 10 MG/ML IJ SOLN: 10.0000 mg | Freq: Once | INTRAMUSCULAR | Status: DC

## 2016-09-13 MED ORDER — BUTALBITAL-APAP-CAFFEINE 50-325-40 MG PO TABS: 1.0000 | ORAL_TABLET | Freq: Four times a day (QID) | ORAL | 0 refills | Status: DC | PRN

## 2016-09-13 MED ORDER — METOCLOPRAMIDE HCL 5 MG/ML IJ SOLN
10.0000 mg | Freq: Once | INTRAMUSCULAR | Status: AC
  Administered 2016-09-13: 10 mg via INTRAVENOUS
  Filled 2016-09-13: qty 2

## 2016-09-13 NOTE — Discharge Instructions (Signed)

## 2016-09-13 NOTE — MAU Note (Signed)
Pt states she woke at 3am not feeling well. Pt states her temperature at that time was 101.9. Pt states she took two tylenol at 0330. Pt states her temperature came down to 100.9. Pt states she has a headache that started today. Pt states she has a sore throat that started today. Pt states she has a slight cough since this morning. Pt states since she woke up she hasn't been feeling the baby move as much as normal. Pt denies contractions, bleeding, and leaking of fluid.

## 2016-09-13 NOTE — MAU Provider Note (Signed)
History     CSN: [id number]  Arrival date and time: 09/13/16 6789   First Provider Initiated Contact with Patient 09/13/16 [phone]      Chief Complaint  Patient presents with  . Fever  . Headache  . Sore Throat   HPI   Ms.Natalie Barker is  31 y.o. female G69P0101 @ 65w4dhere in MAU with fever, headache, and sore throat. Symptoms started at 0300. 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 She took her temp at 0300 and it read 101.9 (auditory). Patient says she overall feels bad "my teeth hurt".  Patient developed a HA and tried tylenol without relief.   + fetal movement, denies vaginal bleeding or leaking of fluid.   OB History    Gravida Para Term Preterm AB Living   2 1   1   1    SAB TAB Ectopic Multiple Live Births           1      Past Medical History:  Diagnosis Date  . Anemia   . Asthma   . Pyelonephritis    in grade school    Past Surgical History:  Procedure Laterality Date  . CESAREAN SECTION N/A 02/16/2013   Procedure: CESAREAN SECTION;  Surgeon: EThurnell Lose MD;  [facility];  Service: Obstetrics;  Laterality: N/A;  . EYE SURGERY     contacts surgically implanted    Family History  Problem Relation Age of Onset  . ALS Maternal Uncle   . Cancer Mother     thyroid at 462 colon ca at 89 breast ca at 259 . Asthma Mother   . Hypertension Father   . Heart disease Father     heart attack, pacemaker  . Diabetes Maternal Grandmother   . Heart disease Maternal Grandmother   . COPD Maternal Grandmother   . Stroke Maternal Grandmother   . Hypertension Maternal Grandmother   . Cancer Maternal Grandmother     thyroid ca at 623 . Cancer Maternal Aunt     colon ca at 458 . Cancer Maternal Grandfather     colon ca at 746   Social History  Substance Use Topics  . Smoking status: Never Smoker  . Smokeless tobacco: Never Used  . Alcohol use No    Allergies:  Allergies  Allergen Reactions  .  Penicillins Hives and Shortness Of Breath    Has patient had a PCN reaction causing immediate rash, facial/tongue/throat swelling, SOB or lightheadedness with hypotension: yes Has patient had a PCN reaction causing severe rash involving mucus membranes or skin necrosis: yes Has patient had a PCN reaction that required hospitalization yes Has patient had a PCN reaction occurring within the last 10 years: no If all of the above answers are "NO", then may proceed with Cephalosporin use.   . Shellfish Allergy Anaphylaxis  . Bactroban [Mupirocin Calcium] Hives  . Iodine Other (See Comments)    Patient has a severe shellfish allergy.    Prescriptions Prior to Admission  Medication Sig Dispense Refill Last Dose  . acetaminophen (TYLENOL) 325 MG tablet Take 325 mg by mouth every 6 (six) hours as needed for pain (For headache.).   06/10/2016 at 0800  . albuterol (PROVENTIL HFA;VENTOLIN HFA) 108 (90 BASE) MCG/ACT inhaler Inhale 1 puff into the lungs every 6 (six) hours as needed for wheezing or shortness of breath.   06/10/2016 at Unknown time  . budesonide (  PULMICORT) 180 MCG/ACT inhaler Inhale 2 puffs into the lungs daily.     . budesonide (RHINOCORT ALLERGY) 32 MCG/ACT nasal spray Place 1 spray into both nostrils 3 (three) times a week.     . calcium carbonate (TUMS - DOSED IN MG ELEMENTAL CALCIUM) 500 MG chewable tablet Chew 1 tablet by mouth daily as needed for heartburn.   02/02/2013 at Unknown  . DICLEGIS 10-10 MG TBEC      . EPINEPHrine (EPIPEN 2-PAK) 0.3 mg/0.3 mL IJ SOAJ injection Inject 0.3 mg into the muscle once.     . FeFum-FePoly-FA-B Cmp-C-Biot (INTEGRA PLUS) CAPS Take 1 capsule by mouth at bedtime.   02/01/2013  . montelukast (SINGULAIR) 10 MG tablet Take 10 mg by mouth daily.     Marland Kitchen omeprazole (PRILOSEC) 20 MG capsule Take 20 mg by mouth daily.     . ondansetron (ZOFRAN ODT) 4 MG disintegrating tablet Take 1 tablet (4 mg total) by mouth every 8 (eight) hours as needed for nausea or  vomiting. 15 tablet 0   . Prenatal Vit-Fe Fumarate-FA (PRENATAL MULTIVITAMIN) TABS tablet Take 1 tablet by mouth daily at 12 noon.   06/09/2016 at Unknown time   Results for orders placed or performed during the hospital encounter of 09/13/16 (from the past 48 hour(s))  Urinalysis, Routine w reflex microscopic     Status: Abnormal   Collection Time: 09/13/16  8:25 AM  Result Value Ref Range   Color, Urine YELLOW YELLOW   APPearance HAZY (A) CLEAR   Specific Gravity, Urine 1.014 1.005 - 1.030   pH 7.0 5.0 - 8.0   Glucose, UA NEGATIVE NEGATIVE mg/dL   Hgb urine dipstick NEGATIVE NEGATIVE   Bilirubin Urine NEGATIVE NEGATIVE   Ketones, ur 20 (A) NEGATIVE mg/dL   Protein, ur NEGATIVE NEGATIVE mg/dL   Nitrite NEGATIVE NEGATIVE   Leukocytes, UA NEGATIVE NEGATIVE  Influenza panel by PCR (type A & B)     Status: None   Collection Time: 09/13/16  9:25 AM  Result Value Ref Range   Influenza A By PCR NEGATIVE NEGATIVE   Influenza B By PCR NEGATIVE NEGATIVE    Comment: (NOTE) The Xpert Xpress Flu assay is intended as an aid in the diagnosis of  influenza and should not be used as a sole basis for treatment.  This  assay is FDA approved for nasopharyngeal swab specimens only. Nasal  washings and aspirates are unacceptable for Xpert Xpress Flu testing.   CBC with Differential     Status: Abnormal   Collection Time: 09/13/16  9:30 AM  Result Value Ref Range   WBC 13.7 (H) 4.0 - 10.5 K/uL   RBC 3.66 (L) 3.87 - 5.11 MIL/uL   Hemoglobin 10.5 (L) 12.0 - 15.0 g/dL   HCT 31.4 (L) 36.0 - 46.0 %   MCV 85.8 78.0 - 100.0 fL   MCH 28.7 26.0 - 34.0 pg   MCHC 33.4 30.0 - 36.0 g/dL   RDW 13.8 11.5 - 15.5 %   Platelets 176 150 - 400 K/uL   Neutrophils Relative % 89 %   Neutro Abs 12.3 (H) 1.7 - 7.7 K/uL   Lymphocytes Relative 6 %   Lymphs Abs 0.8 0.7 - 4.0 K/uL   Monocytes Relative 4 %   Monocytes Absolute 0.5 0.1 - 1.0 K/uL   Eosinophils Relative 1 %   Eosinophils Absolute 0.1 0.0 - 0.7 K/uL    Basophils Relative 0 %   Basophils Absolute 0.0 0.0 - 0.1 K/uL  Review of Systems  Constitutional: Positive for chills and fever.  Gastrointestinal: Negative for abdominal pain.   Physical Exam   Blood pressure 127/67, pulse (!) 119, temperature 98.3 F (36.8 C), temperature source Oral, resp. rate 16, height 5' 8"  (1.727 m), weight 192 lb 12 oz (87.4 kg), SpO2 99 %, unknown if currently breastfeeding.  Physical Exam  Constitutional: She is oriented to person, place, and time. She appears well-developed and well-nourished.  Non-toxic appearance. She has a sickly appearance. She appears ill. No distress.  HENT:  Head: Normocephalic.  Mouth/Throat: Posterior oropharyngeal erythema present. No oropharyngeal exudate, posterior oropharyngeal edema or tonsillar abscesses.  Cardiovascular: Normal rate.   Respiratory: Effort normal and breath sounds normal. No respiratory distress. She has no wheezes. She has no rales. She exhibits no tenderness.  Musculoskeletal: Normal range of motion.  Neurological: She is alert and oriented to person, place, and time.  Skin: Skin is warm. She is not diaphoretic. There is pallor.  Psychiatric: Her behavior is normal.   Fetal Tracing: Baseline: 135 bpm Variability: Moderate  Accelerations: 15x15 Decelerations: None Toco: Quiet   MAU Course  Procedures  None  MDM  LR bolus Headache cocktail: benadryl, decadron, reglan HA 2/10 post medication and fluid  Influenza swab negative. Based on patients symptoms and currently pregnancy status, will treat prophylactic. Tamiflu given 75 mg PO X 1  Discussed patient with Dr. Alesia Richards, ok for discharge home.    Assessment and Plan   A:  1. Flu-like symptoms   2. Headache in pregnancy, antepartum, third trimester     P:  Discharge home in stable condition Rx: Tamiflu, Fioricet Return to MAU if symptoms worsen Increase PO fluids   Lezlie Lye, NP 09/13/2016 3:19 PM

## 2016-09-15 ENCOUNTER — Encounter (HOSPITAL_COMMUNITY): Payer: Self-pay

## 2016-09-15 LAB — OB RESULTS CONSOLE GBS: GBS: POSITIVE

## 2016-09-22 ENCOUNTER — Encounter (HOSPITAL_COMMUNITY)
Admission: RE | Admit: 2016-09-22 | Discharge: 2016-09-22 | Disposition: A | Discharge status: Home / Self Care | Payer: BC Managed Care – PPO | Source: Ambulatory Visit | Attending: Obstetrics and Gynecology | Admitting: Obstetrics and Gynecology

## 2016-09-22 ENCOUNTER — Encounter (HOSPITAL_COMMUNITY): Payer: Self-pay | Admitting: Anesthesiology

## 2016-09-22 LAB — TYPE AND SCREEN
ABO/RH(D): O POS
ANTIBODY SCREEN: NEGATIVE

## 2016-09-22 LAB — CBC
HCT: 33.6 % — ABNORMAL LOW (ref 36.0–46.0)
Hemoglobin: 10.9 g/dL — ABNORMAL LOW (ref 12.0–15.0)
MCH: 27.5 pg (ref 26.0–34.0)
MCHC: 32.4 g/dL (ref 30.0–36.0)
MCV: 84.8 fL (ref 78.0–100.0)
Platelets: 226 10*3/uL (ref 150–400)
RBC: 3.96 MIL/uL (ref 3.87–5.11)
RDW: 14 % (ref 11.5–15.5)
WBC: 13.1 10*3/uL — ABNORMAL HIGH (ref 4.0–10.5)

## 2016-09-22 NOTE — Patient Instructions (Signed)
[place]  09/22/2016   Your procedure is scheduled on:  09/23/2016  Enter through the Main Entrance of [facility] up the phone at the desk and dial [phone].   Call this number if you have problems the morning of surgery: [phone]   Remember:   Do not eat food:After Midnight.  Do not drink clear liquids: After Midnight.  Take these medicines the morning of surgery with A SIP OF WATER: none.  Please bring your inhaler with you.   Do not wear jewelry, make-up or nail polish.  Do not wear lotions, powders, or perfumes. Do not wear deodorant.  Do not shave 48 hours prior to surgery.  Do not bring valuables to the hospital.  [facility] is not   responsible for any belongings or valuables brought to the hospital.  Contacts, dentures or bridgework may not be worn into surgery.  Leave suitcase in the car. After surgery it may be brought to your room.  For patients admitted to the hospital, checkout time is 11:00 AM the day of              discharge.   Patients discharged the day of surgery will not be allowed to drive             home.  Name and phone number of your driver: na  Special Instructions:   N/A   Please read over the following fact sheets that you were given:   Surgical Site Infection Prevention

## 2016-09-22 NOTE — Anesthesia Preprocedure Evaluation (Addendum)
Anesthesia Evaluation  Patient identified by MRN, date of birth, ID band Patient awake    Reviewed: Allergy & Precautions, NPO status , Patient's Chart, lab work & pertinent test results  Airway Mallampati: II  TM Distance: >3 FB Neck ROM: Full    Dental no notable dental hx. (+) Teeth Intact   Pulmonary asthma ,    Pulmonary exam normal breath sounds clear to auscultation- rhonchi       Cardiovascular negative cardio ROS Normal cardiovascular exam Rhythm:Regular Rate:Normal     Neuro/Psych negative neurological ROS  negative psych ROS   GI/Hepatic Neg liver ROS, GERD  Medicated and Controlled,  Endo/Other  negative endocrine ROS  Renal/GU negative Renal ROS  negative genitourinary   Musculoskeletal   Abdominal   Peds  Hematology  (+) anemia ,   Anesthesia Other Findings   Reproductive/Obstetrics (+) Pregnancy Previous C/Section                            Anesthesia Physical Anesthesia Plan  ASA: II  Anesthesia Plan: Spinal   Post-op Pain Management:    Induction:   Airway Management Planned: Natural Airway  Additional Equipment:   Intra-op Plan:   Post-operative Plan:   Informed Consent: I have reviewed the patients History and Physical, chart, labs and discussed the procedure including the risks, benefits and alternatives for the proposed anesthesia with the patient or authorized representative who has indicated his/her understanding and acceptance.     Plan Discussed with: Anesthesiologist, CRNA and Surgeon  Anesthesia Plan Comments:        Anesthesia Quick Evaluation

## 2016-09-23 ENCOUNTER — Inpatient Hospital Stay (HOSPITAL_COMMUNITY): Payer: BC Managed Care – PPO | Admitting: Anesthesiology

## 2016-09-23 ENCOUNTER — Inpatient Hospital Stay (HOSPITAL_COMMUNITY)
Admission: RE | Admit: 2016-09-23 | Discharge: 2016-09-25 | DRG: 766 | Disposition: A | Discharge status: Home / Self Care | Payer: BC Managed Care – PPO | Source: Ambulatory Visit | Attending: Obstetrics and Gynecology | Admitting: Obstetrics and Gynecology

## 2016-09-23 ENCOUNTER — Encounter (HOSPITAL_COMMUNITY)
Admission: RE | Disposition: A | Discharge status: Home / Self Care | Payer: Self-pay | Source: Ambulatory Visit | Attending: Obstetrics and Gynecology

## 2016-09-23 ENCOUNTER — Encounter (HOSPITAL_COMMUNITY): Payer: Self-pay

## 2016-09-23 DIAGNOSIS — Z880 Allergy status to penicillin: Secondary | ICD-10-CM

## 2016-09-23 DIAGNOSIS — O34211 Maternal care for low transverse scar from previous cesarean delivery: Principal | ICD-10-CM | POA: Diagnosis present

## 2016-09-23 DIAGNOSIS — O9952 Diseases of the respiratory system complicating childbirth: Secondary | ICD-10-CM | POA: Diagnosis present

## 2016-09-23 DIAGNOSIS — Z98891 History of uterine scar from previous surgery: Secondary | ICD-10-CM

## 2016-09-23 DIAGNOSIS — O9962 Diseases of the digestive system complicating childbirth: Secondary | ICD-10-CM | POA: Diagnosis present

## 2016-09-23 DIAGNOSIS — K219 Gastro-esophageal reflux disease without esophagitis: Secondary | ICD-10-CM | POA: Diagnosis present

## 2016-09-23 DIAGNOSIS — R631 Polydipsia: Secondary | ICD-10-CM | POA: Diagnosis present

## 2016-09-23 DIAGNOSIS — Z3A39 39 weeks gestation of pregnancy: Secondary | ICD-10-CM

## 2016-09-23 DIAGNOSIS — J45909 Unspecified asthma, uncomplicated: Secondary | ICD-10-CM | POA: Diagnosis present

## 2016-09-23 LAB — RPR: RPR Ser Ql: NONREACTIVE

## 2016-09-23 SURGERY — Surgical Case
Anesthesia: Spinal | Site: Abdomen | Wound class: Clean Contaminated

## 2016-09-23 MED ORDER — METHYLERGONOVINE MALEATE 0.2 MG PO TABS: 0.2000 mg | ORAL_TABLET | ORAL | Status: DC | PRN

## 2016-09-23 MED ORDER — SODIUM CHLORIDE 0.9 % IR SOLN
Status: DC | PRN
  Administered 2016-09-23: 1

## 2016-09-23 MED ORDER — WITCH HAZEL-GLYCERIN EX PADS: 1.0000 "application " | MEDICATED_PAD | CUTANEOUS | Status: DC | PRN

## 2016-09-23 MED ORDER — NALBUPHINE HCL 10 MG/ML IJ SOLN: 5.0000 mg | Freq: Once | INTRAMUSCULAR | Status: DC | PRN

## 2016-09-23 MED ORDER — LORATADINE 10 MG PO TABS: 10.0000 mg | ORAL_TABLET | Freq: Every day | ORAL | Status: DC | PRN

## 2016-09-23 MED ORDER — GENTAMICIN SULFATE 40 MG/ML IJ SOLN
INTRAMUSCULAR | Status: AC
  Administered 2016-09-23: 100 mL via INTRAVENOUS
  Filled 2016-09-23: qty 9.25

## 2016-09-23 MED ORDER — PHENYLEPHRINE 8 MG IN D5W 100 ML (0.08MG/ML) PREMIX OPTIME
INJECTION | INTRAVENOUS | Status: AC
  Filled 2016-09-23: qty 100

## 2016-09-23 MED ORDER — DEXTROSE 5 % IV SOLN
1.0000 ug/kg/h | INTRAVENOUS | Status: DC | PRN
  Filled 2016-09-23: qty 2

## 2016-09-23 MED ORDER — MEPERIDINE HCL 25 MG/ML IJ SOLN: 6.2500 mg | INTRAMUSCULAR | Status: DC | PRN

## 2016-09-23 MED ORDER — TETANUS-DIPHTH-ACELL PERTUSSIS 5-2.5-18.5 LF-MCG/0.5 IM SUSP: 0.5000 mL | Freq: Once | INTRAMUSCULAR | Status: DC

## 2016-09-23 MED ORDER — ACETAMINOPHEN 500 MG PO TABS
1000.0000 mg | ORAL_TABLET | Freq: Four times a day (QID) | ORAL | Status: AC
  Administered 2016-09-23: 1000 mg via ORAL
  Filled 2016-09-23: qty 2

## 2016-09-23 MED ORDER — MORPHINE SULFATE (PF) 0.5 MG/ML IJ SOLN
INTRAMUSCULAR | Status: AC
  Filled 2016-09-23: qty 10

## 2016-09-23 MED ORDER — COCONUT OIL OIL: 1.0000 "application " | TOPICAL_OIL | Status: DC | PRN

## 2016-09-23 MED ORDER — ONDANSETRON HCL 4 MG/2ML IJ SOLN
INTRAMUSCULAR | Status: DC | PRN
  Administered 2016-09-23: 4 mg via INTRAVENOUS

## 2016-09-23 MED ORDER — SIMETHICONE 80 MG PO CHEW
80.0000 mg | CHEWABLE_TABLET | Freq: Three times a day (TID) | ORAL | Status: DC
  Administered 2016-09-23 – 2016-09-24 (×3): 80 mg via ORAL
  Filled 2016-09-23 (×3): qty 1

## 2016-09-23 MED ORDER — SCOPOLAMINE 1 MG/3DAYS TD PT72
1.0000 | MEDICATED_PATCH | Freq: Once | TRANSDERMAL | Status: DC
  Administered 2016-09-23: 1.5 mg via TRANSDERMAL
  Filled 2016-09-23: qty 1

## 2016-09-23 MED ORDER — FENTANYL CITRATE (PF) 100 MCG/2ML IJ SOLN: 25.0000 ug | INTRAMUSCULAR | Status: DC | PRN

## 2016-09-23 MED ORDER — SENNOSIDES-DOCUSATE SODIUM 8.6-50 MG PO TABS
2.0000 | ORAL_TABLET | ORAL | Status: DC
  Administered 2016-09-24 (×2): 2 via ORAL
  Filled 2016-09-23 (×2): qty 2

## 2016-09-23 MED ORDER — OXYTOCIN 10 UNIT/ML IJ SOLN
INTRAVENOUS | Status: DC | PRN
  Administered 2016-09-23: 40 [IU] via INTRAVENOUS

## 2016-09-23 MED ORDER — DIBUCAINE 1 % RE OINT: 1.0000 "application " | TOPICAL_OINTMENT | RECTAL | Status: DC | PRN

## 2016-09-23 MED ORDER — LACTATED RINGERS IV SOLN
INTRAVENOUS | Status: DC
  Administered 2016-09-23 – 2016-09-24 (×2): via INTRAVENOUS

## 2016-09-23 MED ORDER — OXYTOCIN 40 UNITS IN LACTATED RINGERS INFUSION - SIMPLE MED: 2.5000 [IU]/h | INTRAVENOUS | Status: AC

## 2016-09-23 MED ORDER — SIMETHICONE 80 MG PO CHEW
80.0000 mg | CHEWABLE_TABLET | ORAL | Status: DC | PRN
  Administered 2016-09-24: 80 mg via ORAL

## 2016-09-23 MED ORDER — FENTANYL CITRATE (PF) 100 MCG/2ML IJ SOLN
INTRAMUSCULAR | Status: AC
  Filled 2016-09-23: qty 2

## 2016-09-23 MED ORDER — IBUPROFEN 600 MG PO TABS
600.0000 mg | ORAL_TABLET | Freq: Four times a day (QID) | ORAL | Status: DC
  Administered 2016-09-23 – 2016-09-25 (×8): 600 mg via ORAL
  Filled 2016-09-23 (×8): qty 1

## 2016-09-23 MED ORDER — OXYCODONE-ACETAMINOPHEN 5-325 MG PO TABS: 2.0000 | ORAL_TABLET | ORAL | Status: DC | PRN

## 2016-09-23 MED ORDER — NALBUPHINE HCL 10 MG/ML IJ SOLN: 5.0000 mg | INTRAMUSCULAR | Status: DC | PRN

## 2016-09-23 MED ORDER — BUDESONIDE 0.25 MG/2ML IN SUSP
0.2500 mg | Freq: Two times a day (BID) | RESPIRATORY_TRACT | Status: DC
  Filled 2016-09-23 (×7): qty 2

## 2016-09-23 MED ORDER — ACETAMINOPHEN 325 MG PO TABS
650.0000 mg | ORAL_TABLET | ORAL | Status: DC | PRN
  Administered 2016-09-24: 650 mg via ORAL
  Filled 2016-09-23: qty 2

## 2016-09-23 MED ORDER — PRENATAL MULTIVITAMIN CH
1.0000 | ORAL_TABLET | Freq: Every day | ORAL | Status: DC
  Administered 2016-09-24 – 2016-09-25 (×2): 1 via ORAL
  Filled 2016-09-23 (×2): qty 1

## 2016-09-23 MED ORDER — DIPHENHYDRAMINE HCL 25 MG PO CAPS
25.0000 mg | ORAL_CAPSULE | Freq: Four times a day (QID) | ORAL | Status: DC | PRN
  Filled 2016-09-23: qty 1

## 2016-09-23 MED ORDER — ZOLPIDEM TARTRATE 5 MG PO TABS: 5.0000 mg | ORAL_TABLET | Freq: Every evening | ORAL | Status: DC | PRN

## 2016-09-23 MED ORDER — NALOXONE HCL 0.4 MG/ML IJ SOLN: 0.4000 mg | INTRAMUSCULAR | Status: DC | PRN

## 2016-09-23 MED ORDER — LACTATED RINGERS IV SOLN
INTRAVENOUS | Status: DC
  Administered 2016-09-23: 08:00:00 via INTRAVENOUS

## 2016-09-23 MED ORDER — KETOROLAC TROMETHAMINE 30 MG/ML IJ SOLN
INTRAMUSCULAR | Status: AC
Start: 2016-09-23 — End: 2016-09-23
  Filled 2016-09-23: qty 1

## 2016-09-23 MED ORDER — KETOROLAC TROMETHAMINE 30 MG/ML IJ SOLN: 30.0000 mg | Freq: Four times a day (QID) | INTRAMUSCULAR | Status: AC | PRN

## 2016-09-23 MED ORDER — FENTANYL CITRATE (PF) 100 MCG/2ML IJ SOLN
INTRAMUSCULAR | Status: DC | PRN
  Administered 2016-09-23: 20 ug via INTRATHECAL

## 2016-09-23 MED ORDER — MORPHINE SULFATE (PF) 0.5 MG/ML IJ SOLN
INTRAMUSCULAR | Status: DC | PRN
  Administered 2016-09-23: .2 mg via INTRATHECAL

## 2016-09-23 MED ORDER — KETOROLAC TROMETHAMINE 30 MG/ML IJ SOLN
30.0000 mg | Freq: Four times a day (QID) | INTRAMUSCULAR | Status: AC | PRN
  Administered 2016-09-23: 30 mg via INTRAMUSCULAR

## 2016-09-23 MED ORDER — OXYTOCIN 10 UNIT/ML IJ SOLN
INTRAMUSCULAR | Status: AC
  Filled 2016-09-23: qty 4

## 2016-09-23 MED ORDER — EPINEPHRINE 0.3 MG/0.3ML IJ SOAJ
0.3000 mg | Freq: Once | INTRAMUSCULAR | Status: DC | PRN
  Filled 2016-09-23: qty 0.3

## 2016-09-23 MED ORDER — OXYCODONE-ACETAMINOPHEN 5-325 MG PO TABS
1.0000 | ORAL_TABLET | ORAL | Status: DC | PRN
  Administered 2016-09-23 – 2016-09-25 (×5): 1 via ORAL
  Filled 2016-09-23 (×5): qty 1

## 2016-09-23 MED ORDER — LACTATED RINGERS IV SOLN
INTRAVENOUS | Status: DC
  Administered 2016-09-23 (×2): via INTRAVENOUS

## 2016-09-23 MED ORDER — CEFAZOLIN SODIUM-DEXTROSE 2-4 GM/100ML-% IV SOLN
INTRAVENOUS | Status: AC
Start: 2016-09-23 — End: 2016-09-23
  Filled 2016-09-23: qty 100

## 2016-09-23 MED ORDER — SODIUM CHLORIDE 0.9% FLUSH: 3.0000 mL | INTRAVENOUS | Status: DC | PRN

## 2016-09-23 MED ORDER — DIPHENHYDRAMINE HCL 50 MG/ML IJ SOLN: 12.5000 mg | INTRAMUSCULAR | Status: DC | PRN

## 2016-09-23 MED ORDER — BUPIVACAINE IN DEXTROSE 0.75-8.25 % IT SOLN
INTRATHECAL | Status: DC | PRN
Start: 2016-09-23 — End: 2016-09-23
  Administered 2016-09-23: 1.8 mL via INTRATHECAL

## 2016-09-23 MED ORDER — ONDANSETRON HCL 4 MG/2ML IJ SOLN: 4.0000 mg | Freq: Three times a day (TID) | INTRAMUSCULAR | Status: DC | PRN

## 2016-09-23 MED ORDER — ALBUTEROL SULFATE (2.5 MG/3ML) 0.083% IN NEBU: 3.0000 mL | INHALATION_SOLUTION | RESPIRATORY_TRACT | Status: DC | PRN

## 2016-09-23 MED ORDER — MENTHOL 3 MG MT LOZG: 1.0000 | LOZENGE | OROMUCOSAL | Status: DC | PRN

## 2016-09-23 MED ORDER — ONDANSETRON HCL 4 MG/2ML IJ SOLN
INTRAMUSCULAR | Status: AC
  Filled 2016-09-23: qty 2

## 2016-09-23 MED ORDER — SOD CITRATE-CITRIC ACID 500-334 MG/5ML PO SOLN
30.0000 mL | Freq: Once | ORAL | Status: AC
  Administered 2016-09-23: 30 mL via ORAL
  Filled 2016-09-23: qty 15

## 2016-09-23 MED ORDER — PHENYLEPHRINE 8 MG IN D5W 100 ML (0.08MG/ML) PREMIX OPTIME
INJECTION | INTRAVENOUS | Status: DC | PRN
  Administered 2016-09-23: 40 ug/min via INTRAVENOUS

## 2016-09-23 MED ORDER — IBUPROFEN 600 MG PO TABS: 600.0000 mg | ORAL_TABLET | Freq: Four times a day (QID) | ORAL | Status: DC | PRN

## 2016-09-23 MED ORDER — METHYLERGONOVINE MALEATE 0.2 MG/ML IJ SOLN: 0.2000 mg | INTRAMUSCULAR | Status: DC | PRN

## 2016-09-23 MED ORDER — DIPHENHYDRAMINE HCL 25 MG PO CAPS
25.0000 mg | ORAL_CAPSULE | ORAL | Status: DC | PRN
  Administered 2016-09-24 (×2): 25 mg via ORAL
  Filled 2016-09-23: qty 1

## 2016-09-23 MED ORDER — SIMETHICONE 80 MG PO CHEW
80.0000 mg | CHEWABLE_TABLET | ORAL | Status: DC
  Administered 2016-09-24 (×2): 80 mg via ORAL
  Filled 2016-09-23 (×2): qty 1

## 2016-09-23 MED ORDER — BUPIVACAINE IN DEXTROSE 0.75-8.25 % IT SOLN
INTRATHECAL | Status: AC
  Filled 2016-09-23: qty 2

## 2016-09-23 MED ORDER — BUTALBITAL-APAP-CAFFEINE 50-325-40 MG PO TABS: 1.0000 | ORAL_TABLET | Freq: Four times a day (QID) | ORAL | Status: DC | PRN

## 2016-09-23 MED ORDER — IBUPROFEN 600 MG PO TABS: 600.0000 mg | ORAL_TABLET | Freq: Four times a day (QID) | ORAL | Status: DC

## 2016-09-23 SURGICAL SUPPLY — 42 items
ADH SKN CLS APL DERMABOND .7 (GAUZE/BANDAGES/DRESSINGS)
APL SKNCLS STERI-STRIP NONHPOA (GAUZE/BANDAGES/DRESSINGS) ×1
BARRIER ADHS 3X4 INTERCEED (GAUZE/BANDAGES/DRESSINGS) ×5 IMPLANT
BENZOIN TINCTURE PRP APPL 2/3 (GAUZE/BANDAGES/DRESSINGS) ×2 IMPLANT
BRR ADH 4X3 ABS CNTRL BYND (GAUZE/BANDAGES/DRESSINGS) ×2
CHLORAPREP W/TINT 26ML (MISCELLANEOUS) ×3 IMPLANT
CLAMP CORD UMBIL (MISCELLANEOUS) IMPLANT
CLOSURE STERI STRIP 1/2 X4 (GAUZE/BANDAGES/DRESSINGS) ×2 IMPLANT
CLOSURE WOUND 1/2 X4 (GAUZE/BANDAGES/DRESSINGS)
CLOTH BEACON ORANGE TIMEOUT ST (SAFETY) ×3 IMPLANT
DERMABOND ADVANCED (GAUZE/BANDAGES/DRESSINGS)
DERMABOND ADVANCED .7 DNX12 (GAUZE/BANDAGES/DRESSINGS) IMPLANT
DRSG OPSITE POSTOP 4X10 (GAUZE/BANDAGES/DRESSINGS) ×3 IMPLANT
ELECT REM PT RETURN 9FT ADLT (ELECTROSURGICAL) ×3
ELECTRODE REM PT RTRN 9FT ADLT (ELECTROSURGICAL) ×1 IMPLANT
EXTRACTOR VACUUM BELL STYLE (SUCTIONS) IMPLANT
EXTRACTOR VACUUM KIWI (MISCELLANEOUS) ×2 IMPLANT
GLOVE BIO SURGEON STRL SZ7 (GLOVE) ×3 IMPLANT
GLOVE BIOGEL PI IND STRL 7.0 (GLOVE) ×2 IMPLANT
GLOVE BIOGEL PI INDICATOR 7.0 (GLOVE) ×4
GOWN STRL REUS W/TWL LRG LVL3 (GOWN DISPOSABLE) ×6 IMPLANT
HEMOSTAT ARISTA ABSORB 3G PWDR (MISCELLANEOUS) ×2 IMPLANT
KIT ABG SYR 3ML LUER SLIP (SYRINGE) IMPLANT
NDL HYPO 25X5/8 SAFETYGLIDE (NEEDLE) IMPLANT
NEEDLE HYPO 25X5/8 SAFETYGLIDE (NEEDLE) IMPLANT
NS IRRIG 1000ML POUR BTL (IV SOLUTION) ×3 IMPLANT
PACK C SECTION WH (CUSTOM PROCEDURE TRAY) ×3 IMPLANT
PAD ABD 7.5X8 STRL (GAUZE/BANDAGES/DRESSINGS) ×2 IMPLANT
PAD OB MATERNITY 4.3X12.25 (PERSONAL CARE ITEMS) ×3 IMPLANT
PENCIL SMOKE EVAC W/HOLSTER (ELECTROSURGICAL) ×3 IMPLANT
RTRCTR C-SECT PINK 25CM LRG (MISCELLANEOUS) ×3 IMPLANT
STRIP CLOSURE SKIN 1/2X4 (GAUZE/BANDAGES/DRESSINGS) IMPLANT
SUT CHROMIC 0 CTX 36 (SUTURE) IMPLANT
SUT MON AB 4-0 PS1 27 (SUTURE) ×5 IMPLANT
SUT PLAIN 0 NONE (SUTURE) IMPLANT
SUT PLAIN 2 0 XLH (SUTURE) ×3 IMPLANT
SUT VIC AB 0 CTX 36 (SUTURE) ×15
SUT VIC AB 0 CTX36XBRD ANBCTRL (SUTURE) ×5 IMPLANT
SUT VIC AB 2-0 CT1 27 (SUTURE) ×3
SUT VIC AB 2-0 CT1 TAPERPNT 27 (SUTURE) ×1 IMPLANT
TOWEL OR 17X24 6PK STRL BLUE (TOWEL DISPOSABLE) ×3 IMPLANT
TRAY FOLEY BAG SILVER LF 14FR (SET/KITS/TRAYS/PACK) IMPLANT

## 2016-09-23 NOTE — Anesthesia Postprocedure Evaluation (Signed)
Anesthesia Post Note  Patient: Natalie Barker  Procedure(s) Performed: Procedure(s) (LRB): REPEAT CESAREAN SECTION (N/A)  Patient location during evaluation: PACU Anesthesia Type: Spinal Level of consciousness: awake and alert and oriented Pain management: pain level controlled Vital Signs Assessment: post-procedure vital signs reviewed and stable Respiratory status: spontaneous breathing, nonlabored ventilation and respiratory function stable Cardiovascular status: blood pressure returned to baseline and stable Postop Assessment: no headache, no backache, spinal receding, patient able to bend at knees and no signs of nausea or vomiting Anesthetic complications: no        Last Vitals:  Vitals:   09/23/16 0930 09/23/16 0945  BP: 111/68 109/66  Pulse: 73 72  Resp: (!) 24 14  Temp:      Last Pain:  Vitals:   09/23/16 0930  TempSrc:   PainSc: 0-No pain   Pain Goal:                 Calena Salem A.

## 2016-09-23 NOTE — Transfer of Care (Signed)
Immediate Anesthesia Transfer of Care Note  Patient: Natalie Barker  Procedure(s) Performed: Procedure(s) with comments: REPEAT CESAREAN SECTION (N/A) - EDD 09/30/2016  Patient Location: PACU  Anesthesia Type:Spinal  Level of Consciousness: awake, alert  and oriented  Airway & Oxygen Therapy: Patient Spontanous Breathing  Post-op Assessment: Report given to RN and Post -op Vital signs reviewed and stable  Post vital signs: Reviewed and stable  Last Vitals:  Vitals:   09/23/16 0550  BP: 120/69  Pulse: 94  Resp: 18  Temp: 36.5 C    Last Pain:  Vitals:   09/23/16 0550  TempSrc: Oral  PainSc:          Complications: No apparent anesthesia complications

## 2016-09-23 NOTE — H&P (Signed)
History of Present Illness  General:  31 y/o G2 P0101 @ 38 2/7 weeks c/w 8 wk ultrasound presents for a routine ob visit and preop for elective repeat c-section. Pt declines VBAC. Pt has a h/o complete previa with a prolonged inpatient bedrest. Pt delivered at 33 weeks by c-section without complication. 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 She will get that postpartum. Pt was unable to breast feed with her last pregnancy due to lack of milk production. Pt's mother has the same history. Prolactin level this pregnancy is normal. Endocrine working that up as well.   Current Medications  Unknown   ProAir HFA(Albuterol Sulfate HFA) 108 (90 Base) MCG/ACT Aerosol Solution 2 puffs Inhalation as needed Q 4-6 hours   CitraNatal Harmony(Prenatal-DSS-Ca-Fe Cbn-FA-DHA) 90-1 & 300 MG Capsule 1 capsule by mouth daily   Flovent HFA(Fluticasone Propionate HFA) 110 MCG/ACT Aerosol 1 puff Inhalation Twice a day   Cleocin-T(Clindamycin Phosphate) 1 % Gel 1 application to affected area Externally Once a day   EpiPen 2-Pak(EPINEPHrine) 0.3 MG/0.3ML Solution Auto-injector as directed Injection once prn severe allergic reaction   Iron (Ferrous Sulfate) 142 (45 Fe) MG Tablet Extended Release 1 tablet Orally Once a day    Past Medical History  asthma/allergies-SAR and PAR.   Acne.   Severe shellfish and penicillin allergy.   Strong family history of cancers in young ages (thyroid/colon/breast).           Surgical History  eye surgery - corrective lenses 2011  C-section 02/16/13   Family History  Father: alive 75 yrs, skin cancer - nonmelanoma  Mother: alive 64 yrs, colon cancer (dx at age 73), breast cancer bilaterally (dx at 33), thyroid cancer , diagnosed with Breast cancer, Colon cancer  Maternal Grand Father: deceased, colon cancer, diagnosed with Colon cancer  Maternal Grand Mother:  deceased, thyroid cancer  Brother 1: alive, A + W  Maternal aunt: alive, ? colon cancer, diagnosed with Colon cancer  1 brother(s) - healthy.    Social History  General:  Tobacco use  cigarettes: Never smoked Tobacco history last updated 07/16/2016 no EXPOSURE TO PASSIVE SMOKE.  Alcohol: yes, Rare.  Caffeine: yes, soda, 2+ servings daily.  no Recreational drug use.  Exercise: yes.  Marital Status: Married.  Children: girls, 1- Sophia.  EDUCATION: Development worker, community - elementary education, [employer].  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  Seat belt use: yes.  no Tobacco Exposure.    Gyn History  Sexual activity currently sexually active.  Periods : monthly but at different times of the month.  LMP 12/25/15.  Last pap smear date 08/31/12, negative.  Last mammogram date N/A.  Abnormal pap smear no.    OB History  Number of pregnancies 2.  Pregnancy # 1 live birth, C-section delivery, girl, "Sophia" at 52 weeks.  Pregnancy # 2 current.    Allergies  Penicillin (for allergy): cardiac arrest/anaphlaxis: Allergy  ? Eggs: vomiting: Allergy  Bactroban: unknown: Allergy  shell fish: cardiac arrest/anaphlaxis: Allergy  Tamiflu: bloody diarrhea: Allergy   Hospitalization/Major Diagnostic Procedure  childbirth x 1 02/16/13   Review of Systems  Denies fever/chills, chest pain, SOB, headaches, numbness/tingling. No h/o complication with anesthesia, bleeding disorders or blood clots.   Vital Signs  Wt 193, Wt change 5.8 lb, BP sitting 114/73 FHTs 140s.   Physical Examination  GENERAL:  Patient appears alert and oriented.  General  Appearance: well-appearing, well-developed, no acute distress.  Speech: clear.  LUNGS:  Auscultation: no wheezing/rhonchi/rales. CTA bilaterally.  HEART:  Heart sounds: normal. RRR. no murmur.  ABDOMEN:  General: soft nontender, nondistended, no masses.  FEMALE GENITOURINARY:  Pelvic Cervix deferred.Marland Kitchen  EXTREMITIES:   General: No edema or calf tenderness.     Assessments   1. Pre-operative clearance - Z01.818 (Primary)   2. Encounter for supervision of other normal pregnancy, third trimester - Z34.83   3. H/O cesarean section - Z98.891   Treatment  1. Pre-operative clearance  Notes: Risks of repeat c-searean secton reviewed. All questions answered. Pt previously counseled on risks and benefits of VBAC.    Procedure Codes  [phone] POSTOP F U VISIT   Follow Up  2 Weeks post op

## 2016-09-23 NOTE — Interval H&P Note (Signed)
History and Physical Interval Note:  09/23/2016 7:13 AM  Natalie Barker  has presented today for surgery, with the diagnosis of Z98.891 History of Cesarean Section  The various methods of treatment have been discussed with the patient and family. After consideration of risks, benefits and other options for treatment, the patient has consented to  Procedure(s) with comments: REPEAT CESAREAN SECTION (N/A) - EDD 09/30/2016 as a surgical intervention .  The patient's history has been reviewed, patient examined, no change in status, stable for surgery.  I have reviewed the patient's chart and labs.  Questions were answered to the patient's satisfaction.     [name], [name]

## 2016-09-23 NOTE — Brief Op Note (Signed)
09/23/2016  8:38 AM  PATIENT:  Natalie Barker  31 y.o. female  PRE-OPERATIVE DIAGNOSIS:  IUP @ 39 0/7 weeks,  Z98.891 History of Cesarean Section  POST-OPERATIVE DIAGNOSIS:  history of cesarean section, adhesions  PROCEDURE:  Procedure(s) with comments: REPEAT CESAREAN SECTION (N/A) - EDD 09/30/2016    SURGEON:  Surgeon(s) and Role:    * Thurnell Lose, MD - Primary    * Christophe Louis, MD - Assisting  PHYSICIAN ASSISTANT: None  ASSISTANTS: Technician Karlton Lemon)   ANESTHESIA:   spinal  EBL:  Total I/O In: 1500 [I.V.:1500] Out: 550 [Urine:50; Blood:500]  BLOOD ADMINISTERED:none  DRAINS: Urinary Catheter (Foley)   LOCAL MEDICATIONS USED:  NONE  SPECIMEN:  No Specimen  DISPOSITION OF SPECIMEN:  N/A  COUNTS:  YES  TOURNIQUET:  * No tourniquets in log *  DICTATION: .Other Dictation: Dictation Number [id number]  PLAN OF CARE: Admit to inpatient   PATIENT DISPOSITION:  PACU - hemodynamically stable.   Delay start of Pharmacological VTE agent (>24hrs) due to surgical blood loss or risk of bleeding: yes

## 2016-09-23 NOTE — Lactation Note (Signed)
This note was copied from a baby's chart. Lactation Consultation Note  Patient Name: Girl Magaby Rumberger IDPOE'U Date: 09/23/2016    Luna Fuse RN explained to me that Mom requested no Lactation Consultant to assist her.  Mom planning to pump and bottle feed her baby.  RN set up DEBP and assisted Mom with pumping.   Lactation brochure given to RN to share with Mom   Broadus John 09/23/2016, 2:53 PM

## 2016-09-23 NOTE — Addendum Note (Signed)
Addendum  created 09/23/16 1357 by Hewitt Blade, CRNA   Sign clinical note

## 2016-09-23 NOTE — Anesthesia Postprocedure Evaluation (Signed)
Anesthesia Post Note  Patient: Natalie Barker  Procedure(s) Performed: Procedure(s) (LRB): REPEAT CESAREAN SECTION (N/A)  Patient location during evaluation: Mother Baby Anesthesia Type: Spinal Level of consciousness: awake and alert and oriented Pain management: pain level controlled Vital Signs Assessment: post-procedure vital signs reviewed and stable Respiratory status: spontaneous breathing and nonlabored ventilation Cardiovascular status: stable Postop Assessment: no headache, patient able to bend at knees, no backache, no signs of nausea or vomiting, spinal receding and adequate PO intake Anesthetic complications: no        Last Vitals:  Vitals:   09/23/16 1200 09/23/16 1251  BP: (!) 104/50 (!) 106/55  Pulse: (!) 55 (!) 55  Resp:    Temp: 37 C 36.8 C    Last Pain:  Vitals:   09/23/16 1252  TempSrc:   PainSc: 3    Pain Goal: Patients Stated Pain Goal: 3 (09/23/16 1040)               Akshaj Besancon Hristova

## 2016-09-23 NOTE — Anesthesia Procedure Notes (Addendum)
Spinal  Patient location during procedure: OR Start time: 09/23/2016 7:27 AM Staffing Anesthesiologist: Josephine Igo Performed: anesthesiologist  Preanesthetic Checklist Completed: patient identified, site marked, surgical consent, pre-op evaluation, timeout performed, IV checked, risks and benefits discussed and monitors and equipment checked Spinal Block Patient position: sitting Prep: site prepped and draped and DuraPrep Patient monitoring: heart rate, cardiac monitor, continuous pulse ox and blood pressure Approach: midline Location: L5-S1 Injection technique: single-shot Needle Needle type: Pencan  Needle gauge: 24 G Needle length: 9 cm Needle insertion depth: 4 cm Assessment Sensory level: T4 Additional Notes Patient tolerated procedure well. Adequate sensory level.Marland Kitchen

## 2016-09-24 LAB — CBC
HEMATOCRIT: 31 % — AB (ref 36.0–46.0)
Hemoglobin: 10.1 g/dL — ABNORMAL LOW (ref 12.0–15.0)
MCH: 27.7 pg (ref 26.0–34.0)
MCHC: 32.6 g/dL (ref 30.0–36.0)
MCV: 85.2 fL (ref 78.0–100.0)
PLATELETS: 218 10*3/uL (ref 150–400)
RBC: 3.64 MIL/uL — AB (ref 3.87–5.11)
RDW: 14.1 % (ref 11.5–15.5)
WBC: 13.1 10*3/uL — ABNORMAL HIGH (ref 4.0–10.5)

## 2016-09-24 LAB — BIRTH TISSUE RECOVERY COLLECTION (PLACENTA DONATION)

## 2016-09-24 MED ORDER — DOCUSATE SODIUM 100 MG PO CAPS: 100.0000 mg | ORAL_CAPSULE | Freq: Two times a day (BID) | ORAL | 0 refills | Status: DC

## 2016-09-24 MED ORDER — IBUPROFEN 600 MG PO TABS: 600.0000 mg | ORAL_TABLET | Freq: Four times a day (QID) | ORAL | 0 refills | Status: DC

## 2016-09-24 NOTE — Progress Notes (Signed)
Subjective: Postop Day 1: Cesarean Delivery No complaints.  Pain controlled.  Lochia normal.  Breast feeding no.  Pumping breast but no colostrum yet.  Pt does not want lactation support.  Objective: Temp:  [98 F (36.7 C)-98.6 F (37 C)] 98.5 F (36.9 C) (05/02 0920) Pulse Rate:  [54-74] 74 (05/02 0920) Resp:  [16-19] 19 (05/02 0920) BP: (97-110)/(47-60) 110/60 (05/02 0920) SpO2:  [96 %-99 %] 96 % (05/02 0920)  Physical Exam: Gen: NAD Lochia: Not visualized Uterine Fundus: firm, appropriately tender Incision: clean, dry and intact, healing well DVT Evaluation: + Edema present, no calf tenderness bilaterally    Recent Labs  09/22/16 0950 09/24/16 0532  HGB 10.9* 10.1*  HCT 33.6* 31.0*    Assessment/Plan: Status post C-section-doing well postoperatively. H/o inability to lactate.  Declines lactation consult.  Pt pumping.  Will consult with Endocrinologist. Encouraged ambulation in hall TID. Anticipate discharge tomorrow.    Thurnell Lose 09/24/2016, 2:25 PM

## 2016-09-24 NOTE — Discharge Instructions (Signed)

## 2016-09-25 MED ORDER — OXYCODONE-ACETAMINOPHEN 5-325 MG PO TABS: 1.0000 | ORAL_TABLET | Freq: Four times a day (QID) | ORAL | 0 refills | Status: DC | PRN

## 2016-09-25 NOTE — Progress Notes (Signed)
Subjective: Postop Day 2: Cesarean Delivery No complaints.  Pain controlled.  Lochia normal.  No F/C/Cp/SOB.  No nausea or vomiting.  Tolerating gen diet.  +flatus, no BM.  Ambulating and voiding without difficulty.  Objective: Temp:  [98.4 F (36.9 C)-98.5 F (36.9 C)] 98.4 F (36.9 C) (05/03 0540) Pulse Rate:  [67-75] 67 (05/03 0540) Resp:  [18-19] 18 (05/03 0540) BP: (110-113)/(60-67) 112/67 (05/03 0540) SpO2:  [96 %-99 %] 99 % (05/02 1724)  Physical Exam: Gen: NAD CV: RRR Lungs:CTAB Abd: soft, +BS Uterine Fundus: firm, appropriately tender Incision: clean, dry and intact with honeycomb DVT Evaluation: +1 Edema present, no calf tenderness bilaterally    Recent Labs  09/22/16 0950 09/24/16 0532  HGB 10.9* 10.1*  HCT 33.6* 31.0*    Assessment/Plan: 31yo [id number] s/p repeat C-section, POD#2 Doing well postoperatively. Encouraged ambulation in hall TID. Meeting postoperative milestones appropriately, plan for discharge home today    Janyth Pupa, M 09/25/2016, 6:43 AM

## 2016-09-25 NOTE — Op Note (Signed)
NAME:  Natalie Barker, Natalie Barker                ACCOUNT NO.:  MEDICAL RECORD NO.:  [id number]  LOCATION:                                 FACILITY:  PHYSICIAN:  Jola Schmidt, MD   DATE OF BIRTH:  1985/08/17  DATE OF PROCEDURE:  09/23/2016 DATE OF DISCHARGE:                              OPERATIVE REPORT   PREOPERATIVE DIAGNOSES: 1. Intrauterine pregnancy at 39-0/7th weeks. 2. History of previous cesarean section, declines vaginal birth after     cesarean.  POSTOPERATIVE DIAGNOSES: 1. Intrauterine pregnancy at 39-0/7th weeks. 2. History of previous cesarean section, declines vaginal birth after     cesarean. 3. Adhesions.  PROCEDURE:  Repeat low transverse cesarean section with single-layer closure.  SURGEON:  Jola Schmidt, MD  ASSISTANT:  Christophe Louis, MD, and technician.  ANESTHESIA:  Spinal.  ESTIMATED BLOOD LOSS:  500.  URINE: 1. Urine was clear throughout the procedure.  DRAINS:  Foley catheter.  SPECIMENS:  None.  COUNTS:  Correct x3.  PATIENT DISPOSITION:  To PACU, hemodynamically stable.  COMPLICATIONS:  None.  FINDINGS:  Viable female infant.  Clear fluid.  Vertex position.  Lower uterine segment appeared normal, however, the uterus was encased in adhesions.  Unable to access the gutters or the upper abdomen.  DESCRIPTION OF PROCEDURE:  Ms. Natalie Barker was identified in the holding area.  She was then taken to the operating room with IV running.  She underwent a spinal anesthesia without complication.  She was then placed in the dorsal supine position and prepped and draped in a normal sterile fashion.  Gentamycin and clindamycin were infusing in the OR.  A time- out was performed.  SCD's were on her legs and operating.  After incision was marked, after adequate anesthesia was confirmed, the incision was made with a scalpel and carried down to the underlying layer of the Bovie.  There were dense subcutaneous adhesions.  Fascia was incised at the midline  and extended laterally with the curved Mayo scissors.  The fascia was removed from the rectus muscles sharply. Rectus muscles were separated.  Peritoneum was entered and once we entered, we were unable to access the upper abdomen or the size of the uterus if this was the case.  We were down to the serosa.  The bladder appeared to be up a little bit, so bladder flap was developed and the bladder was taken down.  The adhesion was taken down sharply, it was stuck on the left-hand side and then that was taken down.  There was a large vessel that was entered that was grasped with a ring and then a Claiborne Billings that was held while we did the transverse incision on the lower uterine segment and delivered the baby.  With fundal pressure, the baby would rotate to OP, so we used a Kiwi to place on the occiput to allow the baby's head to deliver easily.  The Kiwi was removed.  Nose and mouth were suctioned and baby was delivered with compound presentation of the right hand.  We did not do delayed cord clamping because of the large vessels that was clamped.  Baby was cleaned, mouth suctioned, and handed off to the  awaiting NICU staff.  Attention was turned to the uterus.  The placenta was extracted without difficulty.  The uterus was cleared of all clots and debris.  The edges were grasped with the ring forceps.  That one area was then closed with a figure-of-eight with 0 Vicryl and it was hemostatic.  The uterus was then closed with 0 Vicryl in a continuous running fashion.  The second layer was not performed because we used the serosa.  Then, the anterior portion of the uterus was just oozing. Bovie was used and then Interceed after the abdomen was irrigated. Arista was also used for hemostasis.  The peritoneum could not be closed, it was torn on one side and had become unidentifiable.  On the right-hand side of the upper left quadrant, the bowel was herniating, so we knew we were in, and it was just  stuck anteriorly.  There was also bowel which also looked like it was coming under the round ligament, but there was some mesosalpinx there that did not allow it to herniate.  The muscles were irrigated and cauterized as needed.  Fascia was closed with 0 Vicryl in a continuous running fashion, 2-0 plain gut used to close the subcutaneous space, and 4-0 Monocryl was used to reapproximate the incision.  Steri-Strips, benzoin, and a pressure dressing were to be applied.  All instrument, sponge, and needle counts were correct x3.  The patient tolerated the procedure well.  Baby remained in the room, doing skin to skin, doing well.     Jola Schmidt, MD     EBV/MEDQ  D:  09/23/2016  T:  09/23/2016  Job:  [id number]

## 2016-09-25 NOTE — Discharge Summary (Signed)
OB Discharge Summary     Patient Name: Natalie Barker DOB: 09-30-1985 MRN: [id number]  Date of admission: 09/23/2016 Delivering MD: Thurnell Lose   Date of discharge: 09/25/2016  Admitting diagnosis: Z98.891 History of Cesarean Section Intrauterine pregnancy: [redacted]w[redacted]d    Secondary diagnosis:  Active Problems:   Status post repeat low transverse cesarean section  Additional problems: none     Discharge diagnosis: Term Pregnancy Delivered                                                                                                Post partum procedures:none  Augmentation: N/A  Complications: None  Hospital course:  Sceduled C/S   31y.o. yo [id number] [id number] admitted to the hospital 09/23/2016 for scheduled cesarean section with the following indication:Elective Repeat.  Membrane Rupture Time/Date: 7:53 AM ,09/23/2016   Patient delivered a Viable infant.09/23/2016  Details of operation can be found in separate operative note.  Pateint had an uncomplicated postpartum course.  She is ambulating, tolerating a regular diet, passing flatus, and urinating well. Patient is discharged home in stable condition on  09/25/16         Physical exam  Vitals:   09/24/16 0405 09/24/16 0920 09/24/16 1724 09/25/16 0540  BP: (!) 100/57 110/60 113/63 112/67  Pulse: 65 74 75 67  Resp: 18 19 18 18   Temp: 98 F (36.7 C) 98.5 F (36.9 C) 98.4 F (36.9 C) 98.4 F (36.9 C)  TempSrc: Oral Oral Oral Oral  SpO2: 96% 96% 99%   Weight:      Height:       General: alert, cooperative and no distress Lochia: appropriate Uterine Fundus: firm Incision: Dressing is clean, dry, and intact DVT Evaluation: No evidence of DVT seen on physical exam. Labs: Lab Results  Component Value Date   WBC 13.1 (H) 09/24/2016   HGB 10.1 (L) 09/24/2016   HCT 31.0 (L) 09/24/2016   MCV 85.2 09/24/2016   PLT 218 09/24/2016   CMP Latest Ref Rng & Units 06/10/2016  Glucose 65 - 99 mg/dL 89  BUN 6 - 20 mg/dL 11   Creatinine 0.44 - 1.00 mg/dL 0.56  Sodium 135 - 145 mmol/L 136  Potassium 3.5 - 5.1 mmol/L 3.7  Chloride 101 - 111 mmol/L 108  CO2 22 - 32 mmol/L 21(L)  Calcium 8.9 - 10.3 mg/dL 8.5(L)  Total Protein 6.5 - 8.1 g/dL 6.3(L)  Total Bilirubin 0.3 - 1.2 mg/dL 0.5  Alkaline Phos 38 - 126 U/L 77  AST 15 - 41 U/L 19  ALT 14 - 54 U/L 7(L)    Discharge instruction: per After Visit Summary and "Baby and Me Booklet".  After visit meds:  Allergies as of 09/25/2016      Reactions   Iodine Anaphylaxis   Penicillins Hives, Shortness Of Breath, Other (See Comments)   Has patient had a PCN reaction causing immediate rash, facial/tongue/throat swelling, SOB or lightheadedness with hypotension: Yes Has patient had a PCN reaction causing severe rash involving mucus membranes or skin necrosis: No Has patient had a PCN reaction that  required hospitalization No Has patient had a PCN reaction occurring within the last 10 years: YES If all of the above answers are "NO", then may proceed with Cephalosporin use.   Shellfish Allergy Anaphylaxis   Bactroban [mupirocin Calcium] Hives      Medication List    STOP taking these medications   acetaminophen 325 MG tablet Commonly known as:  TYLENOL   butalbital-acetaminophen-caffeine 50-325-40 MG tablet Commonly known as:  FIORICET, ESGIC   oseltamivir 75 MG capsule Commonly known as:  TAMIFLU     TAKE these medications   albuterol 108 (90 Base) MCG/ACT inhaler Commonly known as:  PROVENTIL HFA;VENTOLIN HFA Inhale 2 puffs into the lungs every 4 (four) hours as needed for wheezing or shortness of breath.   CITRANATAL HARMONY 27-1-260 MG Caps Take 1 capsule by mouth every evening.   docusate sodium 100 MG capsule Commonly known as:  COLACE Take 1 capsule (100 mg total) by mouth 2 (two) times daily.   EPIPEN 2-PAK 0.3 mg/0.3 mL Soaj injection Generic drug:  EPINEPHrine Inject 0.3 mg into the muscle once as needed (for severe allergic reaction).    fluticasone 110 MCG/ACT inhaler Commonly known as:  FLOVENT HFA Inhale 1 puff into the lungs 2 (two) times daily.   ibuprofen 600 MG tablet Commonly known as:  ADVIL,MOTRIN Take 1 tablet (600 mg total) by mouth every 6 (six) hours.   loratadine 10 MG tablet Commonly known as:  CLARITIN Take 10 mg by mouth daily as needed for allergies.   oxyCODONE-acetaminophen 5-325 MG tablet Commonly known as:  PERCOCET/ROXICET Take 1 tablet by mouth every 6 (six) hours as needed for moderate pain or severe pain (pain scale 4-7).   RHINOCORT ALLERGY 32 MCG/ACT nasal spray Generic drug:  budesonide Place 1 spray into both nostrils daily as needed for rhinitis.       Diet: routine diet  Activity: Advance as tolerated. Pelvic rest for 6 weeks.   Outpatient follow up:2 weeks Follow up Appt:No future appointments. Follow up Visit:No Follow-up on file.  Postpartum contraception: Not Discussed  Newborn Data: Live born female  Birth Weight: 8 lb 4.6 oz (3760 g) APGAR: 9, 9  Baby Feeding: Breast Disposition:home with mother   09/25/2016 Janyth Pupa, M, DO

## 2016-10-16 ENCOUNTER — Other Ambulatory Visit: Payer: Self-pay | Admitting: Internal Medicine

## 2016-10-16 DIAGNOSIS — R631 Polydipsia: Secondary | ICD-10-CM

## 2016-10-16 DIAGNOSIS — E230 Hypopituitarism: Secondary | ICD-10-CM

## 2016-11-04 ENCOUNTER — Ambulatory Visit
Admission: RE | Admit: 2016-11-04 | Discharge: 2016-11-04 | Disposition: A | Discharge status: Home / Self Care | Payer: BC Managed Care – PPO | Source: Ambulatory Visit | Attending: Internal Medicine | Admitting: Internal Medicine

## 2016-11-04 DIAGNOSIS — E230 Hypopituitarism: Secondary | ICD-10-CM

## 2016-11-04 DIAGNOSIS — R631 Polydipsia: Secondary | ICD-10-CM

## 2016-11-04 MED ORDER — GADOBENATE DIMEGLUMINE 529 MG/ML IV SOLN
8.0000 mL | Freq: Once | INTRAVENOUS | Status: AC | PRN
  Administered 2016-11-04: 8 mL via INTRAVENOUS

## 2020-07-26 ENCOUNTER — Other Ambulatory Visit: Payer: Self-pay | Admitting: Gastroenterology

## 2020-07-26 DIAGNOSIS — K625 Hemorrhage of anus and rectum: Secondary | ICD-10-CM

## 2020-08-07 ENCOUNTER — Other Ambulatory Visit (HOSPITAL_COMMUNITY): Payer: Self-pay | Admitting: Gastroenterology

## 2020-08-07 ENCOUNTER — Other Ambulatory Visit: Payer: Self-pay | Admitting: Gastroenterology

## 2020-08-07 DIAGNOSIS — K51211 Ulcerative (chronic) proctitis with rectal bleeding: Secondary | ICD-10-CM

## 2020-08-13 ENCOUNTER — Inpatient Hospital Stay: Admission: RE | Admit: 2020-08-13 | Payer: BC Managed Care – PPO | Source: Ambulatory Visit

## 2020-08-15 ENCOUNTER — Other Ambulatory Visit: Payer: Self-pay

## 2020-08-15 ENCOUNTER — Ambulatory Visit (HOSPITAL_COMMUNITY)
Admission: RE | Admit: 2020-08-15 | Discharge: 2020-08-15 | Disposition: A | Discharge status: Home / Self Care | Payer: BC Managed Care – PPO | Source: Ambulatory Visit | Attending: Gastroenterology | Admitting: Gastroenterology

## 2020-08-15 ENCOUNTER — Encounter (HOSPITAL_COMMUNITY): Payer: Self-pay

## 2020-08-15 DIAGNOSIS — K51211 Ulcerative (chronic) proctitis with rectal bleeding: Secondary | ICD-10-CM | POA: Insufficient documentation

## 2020-08-15 MED ORDER — IOHEXOL 300 MG/ML  SOLN
100.0000 mL | Freq: Once | INTRAMUSCULAR | Status: AC | PRN
  Administered 2020-08-15: 100 mL via INTRAVENOUS

## 2021-08-23 ENCOUNTER — Encounter: Payer: Self-pay | Admitting: *Deleted

## 2021-08-23 ENCOUNTER — Encounter: Payer: Self-pay | Admitting: Family Medicine

## 2021-08-23 ENCOUNTER — Ambulatory Visit: Payer: BC Managed Care – PPO | Admitting: Family Medicine

## 2021-08-23 VITALS — BP 120/76 | HR 77 | Temp 98.6°F | Ht 69.0 in | Wt 203.1 lb

## 2021-08-23 DIAGNOSIS — R051 Acute cough: Secondary | ICD-10-CM

## 2021-08-23 DIAGNOSIS — Z23 Encounter for immunization: Secondary | ICD-10-CM | POA: Diagnosis not present

## 2021-08-23 DIAGNOSIS — L409 Psoriasis, unspecified: Secondary | ICD-10-CM

## 2021-08-23 DIAGNOSIS — J4520 Mild intermittent asthma, uncomplicated: Secondary | ICD-10-CM

## 2021-08-23 DIAGNOSIS — E538 Deficiency of other specified B group vitamins: Secondary | ICD-10-CM | POA: Diagnosis not present

## 2021-08-23 DIAGNOSIS — K51011 Ulcerative (chronic) pancolitis with rectal bleeding: Secondary | ICD-10-CM | POA: Insufficient documentation

## 2021-08-23 MED ORDER — CYANOCOBALAMIN 1000 MCG/ML IJ SOLN
1000.0000 ug | Freq: Once | INTRAMUSCULAR | Status: AC
  Administered 2021-08-23: 1000 ug via INTRAMUSCULAR

## 2021-08-23 MED ORDER — AZITHROMYCIN 250 MG PO TABS: ORAL_TABLET | ORAL | 0 refills | Status: AC

## 2021-08-23 MED ORDER — CYANOCOBALAMIN 1000 MCG/ML IJ SOLN: 1000.0000 ug | INTRAMUSCULAR | 5 refills | Status: DC

## 2021-08-23 MED ORDER — "LUER LOCK SAFETY SYRINGES 25G X 1"" 3 ML MISC": 1.0000 | 3 refills | Status: DC

## 2021-08-23 NOTE — Progress Notes (Signed)
? ?New Patient Office Visit ? ?Subjective:  ?Patient ID: Natalie Barker, female    DOB: Mar 02, 1986  Age: 36 y.o. MRN: [id number] ? ?CC:  ?Chief Complaint  ?Patient presents with  ? Establish Care  ? Cough  ?  Causing a rattle in lungs that started about 3 weeks ago ?  ? ? ?HPI ?Natalie Barker presents for new pt.  Cough ? Cough for 3 wks-not really sick, but some cough and "rattle" in lungs.  No f/c.  Not sob.  Rattle on L side.  Cough up green/brown in am.  Covid neg.  Worse to lie down.  No CP ?Ulcerative Colitis-on Stelara now-new.  UC flaring now so on iron as bleeding. Dx 3 yrs ago.  Got psoriasis from Humira-stopped meds in dec.  Had labs.  B12 low as well so rec injections.  Wants pt to get shingrix.  ?Asthma-mild intermitt-proair occ.  Winter flares. ? ? ?Past Medical History:  ?Diagnosis Date  ? Allergy   ? Anemia   ? Anxiety   ? Asthma   ? Psoriasis   ? Pyelonephritis   ? in grade school  ? Ulcer February 2021  ? Ulcerative colitis  ? ? ?Past Surgical History:  ?Procedure Laterality Date  ? CESAREAN SECTION N/A 02/16/2013  ? Procedure: CESAREAN SECTION;  Surgeon: Thurnell Lose, MD;  [facility];  Service: Obstetrics;  Laterality: N/A;  ? CESAREAN SECTION N/A 09/23/2016  ? Procedure: REPEAT CESAREAN SECTION;  Surgeon: Thurnell Lose, MD;  Location: [place];  Service: Obstetrics;  Laterality: N/A;  EDD 09/30/2016  ? EYE SURGERY    ? contacts surgically implanted  ? ? ?Family History  ?Problem Relation Age of Onset  ? Cancer Mother   ?     thyroid at 29, colon ca at 53, breast ca at 26  ? Asthma Mother   ? Hypertension Father   ? Heart disease Father   ?     heart attack, pacemaker  ? Birth defects Brother   ? Asthma Daughter   ? Cancer Maternal Aunt   ?     colon ca at 75  ? ALS Maternal Uncle   ? Stroke Paternal Uncle   ? Diabetes Maternal Grandmother   ? Heart disease Maternal Grandmother   ? COPD Maternal Grandmother   ? Stroke Maternal Grandmother   ? Hypertension Maternal  Grandmother   ? Cancer Maternal Grandmother   ?     thyroid ca at 49  ? Cancer Maternal Grandfather   ?     colon ca at 41  ? Stroke Paternal Grandmother   ? Hyperlipidemia Paternal Grandfather   ? Alcohol abuse Paternal Grandfather   ? ? ?Social History  ? ?Socioeconomic History  ? Marital status: Married  ?  Spouse name: Not on file  ? Number of children: 2  ? Years of education: Not on file  ? Highest education level: Not on file  ?Occupational History  ? Not on file  ?Tobacco Use  ? Smoking status: Never  ? Smokeless tobacco: Never  ? Tobacco comments:  ?  Never used it  ?Vaping Use  ? Vaping Use: Never used  ?Substance and Sexual Activity  ? Alcohol use: Not Currently  ? Drug use: Never  ? Sexual activity: Yes  ?  Birth control/protection: Condom  ?Other Topics Concern  ? Not on file  ?Social History Narrative  ? 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 ?Social Determinants of Health  ? ?Financial  Resource Strain: Not on file  ?Food Insecurity: Not on file  ?Transportation Needs: Not on file  ?Physical Activity: Not on file  ?Stress: Not on file  ?Social Connections: Not on file  ?Intimate Partner Violence: Not on file  ? ? ?ROS  ?ROS: ?Gen: no fever, chills  ?Skin: no rash, itching ?ENT: no ear pain, ear drainage, nasal congestion, rhinorrhea, sinus pressure, sore throat ?Eyes: no blurry vision, double vision ?Resp: HPI ?CV: no CP, palpitations, LE edema,  ?GI: no heartburn, n/v//c, abd pain.  Intermitt diarrhea ?GU: no dysuria, urgency, frequency, hematuria.  Placenta previa ruptured first preg 32.5 wks..  menses reg.  ?MSK: no joint pain, myalgias, back pain ?Neuro: no dizziness, weakness, vertigo.  Mild ha from meds ?Psych: no depression, anxiety, insomnia, SI  ? ?Objective:  ? ?Today's Vitals: BP 120/76   Pulse 77   Temp 98.6 ?F (37 ?C) (Temporal)   Ht 5' 9" (1.753 m)   Wt 203 lb 2 oz (92.1 kg)   LMP 08/22/2021 (Exact Date)   SpO2 98%   Breastfeeding No   BMI 30.00 kg/m?  ? ?Physical Exam  ?Gen: WDWN NAD O[facility] ?HEENT:  NCAT, conjunctiva not injected, sclera nonicteric ?TM WNL B, OP moist, no exudates  ?NECK:  supple, no thyromegaly, no nodes, no carotid bruits ?CARDIAC: RRR, S1S2+, no murmur. DP 2+B ?LUNGS: CTAB. No wheezes.  Some coarseness L base ?ABDOMEN:  BS+, soft, NTND, No HSM, no masses ?EXT:  no edema ?MSK: no gross abnormalities.  ?NEURO: A&O x3.  CN II-XII intact.  ?PSYCH: normal mood. Good eye contact  ? ?On pt phone labs from 07/31/2021 sodium 137 potassium 3.9 chloride 104 CO2 24 BUN 7 creatinine 1.0 glucose 89 calcium 9.1 AST 18 ALT 9 bilirubin 0.2 alk phos 137 albumin 3.6 protein 7.6 WBC 9.9 hemoglobin 11.8 hematocrit 36.9 platelets 444 ferritin 22 iron 31 TIBC 337 hep C negative vitamin D 27 vitamin B12 188 ? ?Assessment & Plan:  ? ?Problem List Items Addressed This Visit   ? ?  ? Respiratory  ? Asthma  ?  ? Digestive  ? Ulcerative pancolitis with rectal bleeding ([place])  ?  ? Musculoskeletal and Integument  ? Psoriasis  ?  ? Other  ? Vitamin B 12 deficiency  ? Relevant Medications  ? cyanocobalamin ((VITAMIN B-12)) injection 1,000 mcg  ? ?Other Visit Diagnoses   ? ? Acute cough    -  Primary  ? Need for shingles vaccine      ? Relevant Orders  ? Varicella-zoster vaccine IM (Shingrix)  ? ?  ? Cough-some brown/green mucus-will do zpk.  Can use inhaler.  If worse, no change, need CXR ?Ulcerative colitis-newly on stelara.  Managed by [facility].  Told to get shingrix d/t meds.   ?Psoiasis-from humira-now on Stelara. ?B12 deficiency-suspect from UC-injections weekly x4 then monthly.  ?Asthma-mild intermitt.  Albuterol prn ? ?Outpatient Encounter Medications as of 08/23/2021  ?Medication Sig  ? albuterol (PROVENTIL HFA;VENTOLIN HFA) 108 (90 BASE) MCG/ACT inhaler Inhale 2 puffs into the lungs every 4 (four) hours as needed for wheezing or shortness of breath.   ? azithromycin (ZITHROMAX) 250 MG tablet Take 2 tablets on day 1, then 1 tablet daily on days 2 through 5  ? cyanocobalamin (,VITAMIN B-12,) 1000 MCG/ML injection Inject 1  mL (1,000 mcg total) into the muscle once a week. Weekly for 4 weeks, then monthly  ? EPINEPHrine 0.3 mg/0.3 mL IJ SOAJ injection Inject 0.3 mg into the muscle once as  needed (for severe allergic reaction).   ? Ferrous Sulfate (IRON PO) Take by mouth.  ? loratadine (CLARITIN) 10 MG tablet Take 10 mg by mouth daily as needed for allergies.  ? Multiple Vitamin (MULTI VITAMIN DAILY PO) Take by mouth.  ? STELARA 90 MG/ML SOSY injection Inject into the skin.  ? SYRINGE-NEEDLE, DISP, 3 ML (LUER LOCK SAFETY SYRINGES) 25G X 1" 3 ML MISC 1 each by Does not apply route once a week.  ? triamcinolone cream (KENALOG) 0.1 % Apply topically.  ? VITAMIN D PO Take by mouth.  ? [DISCONTINUED] budesonide (RHINOCORT ALLERGY) 32 MCG/ACT nasal spray Place 1 spray into both nostrils daily as needed for rhinitis.   ? [DISCONTINUED] docusate sodium (COLACE) 100 MG capsule Take 1 capsule (100 mg total) by mouth 2 (two) times daily.  ? [DISCONTINUED] fluticasone (FLOVENT HFA) 110 MCG/ACT inhaler Inhale 1 puff into the lungs 2 (two) times daily.  ? [DISCONTINUED] ibuprofen (ADVIL,MOTRIN) 600 MG tablet Take 1 tablet (600 mg total) by mouth every 6 (six) hours.  ? [DISCONTINUED] oxyCODONE-acetaminophen (PERCOCET/ROXICET) 5-325 MG tablet Take 1 tablet by mouth every 6 (six) hours as needed for moderate pain or severe pain (pain scale 4-7).  ? [DISCONTINUED] Prenat-FeFmCb-DSS-FA-DHA w/o A (CITRANATAL HARMONY) 27-1-260 MG CAPS Take 1 capsule by mouth every evening.   ? ?Facility-Administered Encounter Medications as of 08/23/2021  ?Medication  ? cyanocobalamin ((VITAMIN B-12)) injection 1,000 mcg  ? ? ?Follow-up: Return in about 6 months (around 02/22/2022) for 6 mo f/u me.    2 mo nurse for shingrix.Marland Kitchen  ? ?Wellington Hampshire, MD ?

## 2021-08-23 NOTE — Patient Instructions (Addendum)
Welcome to [facility]! It was a pleasure meeting you today. ? ?As discussed, Please schedule a 6 month follow up visit today. ? ?Gyn Dr. Stanton Kidney "[place]"  Sabra Heck ?B12 weekly x 4 then monthly ? ? ?PLEASE NOTE: ? ?If you had any LAB tests please let us know if you have not heard back within a few days. You may see your results on MyChart before we have a chance to review them but we will give you a call once they are reviewed by [name]. If we ordered any REFERRALS today, please let us know if you have not heard from their office within the next week.  ?Let us know through MyChart if you are needing REFILLS, or have your pharmacy send [name] the request. You can also use MyChart to communicate with me or any office staff. ? ?Please try these tips to maintain a healthy lifestyle: ? ?Eat most of your calories during the day when you are active. Eliminate processed foods including packaged sweets (pies, cakes, cookies), reduce intake of potatoes, white bread, white pasta, and white rice. Look for whole grain options, oat flour or almond flour. ? ?Each meal should contain half fruits/vegetables, one quarter protein, and one quarter carbs (no bigger than a computer mouse). ? ?Cut down on sweet beverages. This includes juice, soda, and sweet tea. Also watch fruit intake, though this is a healthier sweet option, it still contains natural sugar! Limit to 3 servings daily. ? ?Drink at least 1 glass of water with each meal and aim for at least 8 glasses per day ? ?Exercise at least 150 minutes every week.   ?

## 2022-02-17 ENCOUNTER — Encounter: Payer: Self-pay | Admitting: *Deleted

## 2022-02-19 ENCOUNTER — Ambulatory Visit: Payer: BC Managed Care – PPO | Admitting: Family Medicine

## 2022-02-19 ENCOUNTER — Encounter: Payer: Self-pay | Admitting: Family Medicine

## 2022-02-19 VITALS — BP 122/76 | HR 72 | Temp 98.1°F | Ht 69.0 in | Wt 200.4 lb

## 2022-02-19 DIAGNOSIS — F419 Anxiety disorder, unspecified: Secondary | ICD-10-CM | POA: Diagnosis not present

## 2022-02-19 DIAGNOSIS — J069 Acute upper respiratory infection, unspecified: Secondary | ICD-10-CM

## 2022-02-19 DIAGNOSIS — J4520 Mild intermittent asthma, uncomplicated: Secondary | ICD-10-CM | POA: Diagnosis not present

## 2022-02-19 DIAGNOSIS — E538 Deficiency of other specified B group vitamins: Secondary | ICD-10-CM

## 2022-02-19 DIAGNOSIS — K51011 Ulcerative (chronic) pancolitis with rectal bleeding: Secondary | ICD-10-CM

## 2022-02-19 MED ORDER — BUSPIRONE HCL 5 MG PO TABS: 5.0000 mg | ORAL_TABLET | Freq: Two times a day (BID) | ORAL | 5 refills | Status: DC

## 2022-02-19 MED ORDER — AZITHROMYCIN 250 MG PO TABS: ORAL_TABLET | ORAL | 0 refills | Status: AC

## 2022-02-19 NOTE — Patient Instructions (Signed)
It was very nice to see you today! Zpack to hold   PLEASE NOTE:  If you had any lab tests please let us know if you have not heard back within a few days. You may see your results on MyChart before we have a chance to review them but we will give you a call once they are reviewed by [name]. If we ordered any referrals today, please let us know if you have not heard from their office within the next week.   Please try these tips to maintain a healthy lifestyle:  Eat most of your calories during the day when you are active. Eliminate processed foods including packaged sweets (pies, cakes, cookies), reduce intake of potatoes, white bread, white pasta, and white rice. Look for whole grain options, oat flour or almond flour.  Each meal should contain half fruits/vegetables, one quarter protein, and one quarter carbs (no bigger than a computer mouse).  Cut down on sweet beverages. This includes juice, soda, and sweet tea. Also watch fruit intake, though this is a healthier sweet option, it still contains natural sugar! Limit to 3 servings daily.  Drink at least 1 glass of water with each meal and aim for at least 8 glasses per day  Exercise at least 150 minutes every week.

## 2022-02-19 NOTE — Progress Notes (Signed)
Subjective:     Patient ID: Natalie Barker, female    DOB: 06-23-1985, 36 y.o.   MRN: [id number]  Chief Complaint  Patient presents with   Follow-up    6 month follow-up     HPI  UC-on Stelara.  Seeing GI at [facility].  Does every 8 wks.  This is 4th dose.  Small patch of psoriasis. labs Asthma-mild intermitt.  Proair occ-1-2/wk B12 deficiency-dong injections monthly.  Last one 3 wks ago.  Does on Fridays.  Head clogged -2 days.  No fevers.  Some tenderness maxillary. No sore throat.  R ear clogged.  No cough.   Some anxiety at work and stress. Exercises. ADHD can spike when anxiety up.   Health Maintenance Due  Topic Date Due   PAP SMEAR-Modifier  03/06/2019    Past Medical History:  Diagnosis Date   Allergy    Anemia    Anxiety    Asthma    Psoriasis    Pyelonephritis    in grade school   Ulcer February 2021   Ulcerative colitis    Past Surgical History:  Procedure Laterality Date   CESAREAN SECTION N/A 02/16/2013   Procedure: CESAREAN SECTION;  Surgeon: Thurnell Lose, MD;  Location: [facility];  Service: Obstetrics;  Laterality: N/A;   CESAREAN SECTION N/A 09/23/2016   Procedure: REPEAT CESAREAN SECTION;  Surgeon: Thurnell Lose, MD;  Location: [place];  Service: Obstetrics;  Laterality: N/A;  EDD 09/30/2016   EYE SURGERY     contacts surgically implanted    Outpatient Medications Prior to Visit  Medication Sig Dispense Refill   albuterol (PROVENTIL HFA;VENTOLIN HFA) 108 (90 BASE) MCG/ACT inhaler Inhale 2 puffs into the lungs every 4 (four) hours as needed for wheezing or shortness of breath.      cyanocobalamin (,VITAMIN B-12,) 1000 MCG/ML injection Inject 1 mL (1,000 mcg total) into the muscle once a week. Weekly for 4 weeks, then monthly 4 mL 5   EPINEPHrine 0.3 mg/0.3 mL IJ SOAJ injection Inject 0.3 mg into the muscle once as needed (for severe allergic reaction).      Ferrous Sulfate (IRON PO) Take by mouth.     loratadine (CLARITIN) 10 MG tablet Take  10 mg by mouth daily as needed for allergies.     Multiple Vitamin (MULTI VITAMIN DAILY PO) Take by mouth.     STELARA 90 MG/ML SOSY injection Inject into the skin.     SYRINGE-NEEDLE, DISP, 3 ML (LUER LOCK SAFETY SYRINGES) 25G X 1" 3 ML MISC 1 each by Does not apply route once a week. 10 each 3   VITAMIN D PO Take by mouth.     triamcinolone cream (KENALOG) 0.1 % Apply topically. (Patient not taking: Reported on 02/19/2022)     No facility-administered medications prior to visit.    Allergies  Allergen Reactions   Ciprofloxacin     Other reaction(s): Dizziness, severe dizziness   Iodine Anaphylaxis    Other reaction(s): anaphylaxis   Oseltamivir     Other reaction(s): Other (See Comments) Bloody diarrhea Other reaction(s): bloody diarrhea   Penicillins Hives, Shortness Of Breath, Other (See Comments) and Anaphylaxis    Has patient had a PCN reaction causing immediate rash, facial/tongue/throat swelling, SOB or lightheadedness with hypotension: Yes Has patient had a PCN reaction causing severe rash involving mucus membranes or skin necrosis: No Has patient had a PCN reaction that required hospitalization No Has patient had a PCN reaction occurring within the last 10  years: YES If all of the above answers are "NO", then may proceed with Cephalosporin use.  Has patient had a PCN reaction causing immediate rash, facial/tongue/throat swelling, SOB or lightheadedness with hypotension: Yes  Other reaction(s): cardiac arrest/anaphlaxis   Shellfish Allergy Anaphylaxis   Shellfish-Derived Products Anaphylaxis    Other reaction(s): cardiac arrest/anaphlaxis Cardiac Arrest   Adalimumab     Other reaction(s): caused cirrhosis, Dizziness   Bactroban [Mupirocin Calcium] Hives   Eggs Or Egg-Derived Products     Other reaction(s): vomiting   Metronidazole     Other reaction(s): Dizziness, severe dizziness   Other     Other reaction(s): Vomiting Eggs   Mupirocin Hives and Rash    Other  reaction(s): unknown   ROS neg/noncontributory except as noted HPI/below Some HA occ.  Goes to Yoga.       Objective:     BP 122/76   Pulse 72   Temp 98.1 F (36.7 C) (Temporal)   Ht 5' 9"  (1.753 m)   Wt 200 lb 6 oz (90.9 kg)   LMP 01/29/2022 (Exact Date)   SpO2 99%   BMI 29.59 kg/m  Wt Readings from Last 3 Encounters:  02/19/22 200 lb 6 oz (90.9 kg)  08/23/21 203 lb 2 oz (92.1 kg)  09/23/16 193 lb (87.5 kg)    Physical Exam   Gen: WDWN NAD HEENT: NCAT, conjunctiva not injected, sclera nonicteric TM WNL B, OP moist, no exudates .  Sinuses some TTP NECK:  supple, no thyromegaly, no nodes, no carotid bruits CARDIAC: RRR, S1S2+, no murmur. DP 2+B LUNGS: CTAB. No wheezes ABDOMEN:  BS+, soft, NTND, No HSM, no masses EXT:  no edema MSK: no gross abnormalities.  NEURO: A&O x3.  CN II-XII intact.  PSYCH: normal mood. Good eye contact  Reviewed labs from 12/04/21.     Assessment & Plan:   Problem List Items Addressed This Visit       Respiratory   Asthma     Digestive   Ulcerative pancolitis with rectal bleeding ([place]) - Primary     Other   Vitamin B 12 deficiency   Other Visit Diagnoses     Anxiety       Relevant Medications   busPIRone (BUSPAR) 5 MG tablet   Viral upper respiratory tract infection       Relevant Medications   azithromycin (ZITHROMAX) 250 MG tablet      Asthma-mild intermitt.  Using proair occ.  Cont.  Will call when needs refill. Ulcerative colitis-chronic.  Controlled on Stelara.  Cont.  Managed GI at [facility] B12 deficiency-chronic.  Prob from UC.  Continue monthly injections.   4.  Anxiety-mild.  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  Will trial BuSpar 16m bid(may need higher doses) 5.  URI-immunocompromised(Stelara).  Will monitor for now.  Rx for zpk given if worsening, etc.    F/u 6 mo  Meds ordered this encounter  Medications   busPIRone (BUSPAR) 5 MG tablet    Sig: Take 1 tablet (5 mg total) by mouth 2 (two) times daily.     Dispense:  60 tablet    Refill:  5   azithromycin (ZITHROMAX) 250 MG tablet    Sig: Take 2 tablets on day 1, then 1 tablet daily on days 2 through 5    Dispense:  6 tablet    Refill:  0    AWellington Hampshire MD

## 2022-03-15 ENCOUNTER — Other Ambulatory Visit: Payer: Self-pay | Admitting: Family Medicine

## 2022-03-24 ENCOUNTER — Encounter: Payer: Self-pay | Admitting: Family Medicine

## 2022-03-24 MED ORDER — AMPHETAMINE-DEXTROAMPHET ER 10 MG PO CP24: 10.0000 mg | ORAL_CAPSULE | Freq: Every day | ORAL | 0 refills | Status: DC

## 2022-03-28 ENCOUNTER — Other Ambulatory Visit: Payer: Self-pay | Admitting: Family Medicine

## 2022-04-01 ENCOUNTER — Other Ambulatory Visit: Payer: Self-pay

## 2022-04-10 ENCOUNTER — Telehealth: Payer: Self-pay | Admitting: Family Medicine

## 2022-04-10 NOTE — Telephone Encounter (Signed)
PCP team states: -Prior authorization received. Requesting information the PCP team does not have.  -Appointment needed.  amphetamine-dextroamphetamine (ADDERALL XR) 10 MG 24 hr capsule [[id number]]   Prior authorization placed in provider's box. Covermymed [id number]

## 2022-04-11 ENCOUNTER — Other Ambulatory Visit: Payer: Self-pay

## 2022-04-11 NOTE — Telephone Encounter (Signed)
Noted  

## 2022-04-11 NOTE — Telephone Encounter (Signed)
Patient returned Natalie Barker's call from 04/10/22 in regards to scheduling an OV   Patient states: -She was informed by pharmacy that prior authorization was approved but they were unable to fill medication due to it being out of stock   Katie Nelson,CMA, was able to speak with patient and better explain why an OV was needed on our end.

## 2022-04-11 NOTE — Telephone Encounter (Signed)
PA has been approved. [facility] just does not have medication in stock. I have found it in stock at [facility]. New refill request sent to PCP with correct pharmacy. Patient is aware that script will be sent to [facility].

## 2022-04-11 NOTE — Telephone Encounter (Signed)
PA was approved, new form was faxed over after approval. Patient need medication sent to a different pharmacy. Please see message below.

## 2022-04-12 ENCOUNTER — Other Ambulatory Visit (HOSPITAL_BASED_OUTPATIENT_CLINIC_OR_DEPARTMENT_OTHER): Payer: Self-pay

## 2022-04-12 ENCOUNTER — Other Ambulatory Visit: Payer: Self-pay | Admitting: Family Medicine

## 2022-04-12 MED ORDER — AMPHETAMINE-DEXTROAMPHET ER 10 MG PO CP24
10.0000 mg | ORAL_CAPSULE | Freq: Every day | ORAL | 0 refills | Status: DC
  Filled 2022-04-12: qty 30, 30d supply, fill #0

## 2022-04-14 ENCOUNTER — Other Ambulatory Visit (HOSPITAL_BASED_OUTPATIENT_CLINIC_OR_DEPARTMENT_OTHER): Payer: Self-pay

## 2022-04-14 NOTE — Telephone Encounter (Signed)
Patient called to check on Rx: Amphetamine-Dextroamphet ER 10MG er capsules that was sent to the pharmacy. Spoke with pharmacy and she stated that a PA is required for this RX, the other Rx that was sent was tablets, this is for the extended release capsules. PA completed through covermymeds on 04/14/22, pending decision.   Natalie Barker KeyEmilio Barker - Rx #: [id number] Need help? Call us at [phone] Status Sent to [facility] Amphetamine-Dextroamphet ER 10MG er capsules Form [employer] Electronic PA Form [phone] NCPDP) Original Claim Info [place] REQ-MD [place].DRUG REQUIRES PRIOR AUTHORIZATION

## 2022-04-21 NOTE — Telephone Encounter (Signed)
Patient requests to be called at ph# [phone]  for status of RX for ADHD medication.  Patient states Pharmacy told Patient they received a RX for a different medication than Adderall that would require a PA.  Patient states she has been trying to get the ADHD medication for over a month with no success.

## 2022-04-23 ENCOUNTER — Other Ambulatory Visit: Payer: Self-pay | Admitting: Family Medicine

## 2022-04-23 ENCOUNTER — Encounter: Payer: Self-pay | Admitting: *Deleted

## 2022-04-23 NOTE — Telephone Encounter (Signed)
Please see message below concerning medication for ADHD.

## 2022-04-23 NOTE — Telephone Encounter (Signed)
Patient notified that PA is still pending, they needed additional information.

## 2022-04-24 ENCOUNTER — Other Ambulatory Visit: Payer: Self-pay | Admitting: Family Medicine

## 2022-04-24 ENCOUNTER — Other Ambulatory Visit (HOSPITAL_BASED_OUTPATIENT_CLINIC_OR_DEPARTMENT_OTHER): Payer: Self-pay

## 2022-04-24 MED ORDER — AMPHETAMINE-DEXTROAMPHETAMINE 10 MG PO TABS: 10.0000 mg | ORAL_TABLET | Freq: Two times a day (BID) | ORAL | 0 refills | Status: DC

## 2022-04-30 ENCOUNTER — Other Ambulatory Visit (HOSPITAL_BASED_OUTPATIENT_CLINIC_OR_DEPARTMENT_OTHER): Payer: Self-pay

## 2022-05-23 ENCOUNTER — Encounter: Payer: Self-pay | Admitting: Family Medicine

## 2022-05-23 ENCOUNTER — Telehealth (INDEPENDENT_AMBULATORY_CARE_PROVIDER_SITE_OTHER): Payer: BC Managed Care – PPO | Admitting: Family Medicine

## 2022-05-23 DIAGNOSIS — F900 Attention-deficit hyperactivity disorder, predominantly inattentive type: Secondary | ICD-10-CM | POA: Insufficient documentation

## 2022-05-23 DIAGNOSIS — F419 Anxiety disorder, unspecified: Secondary | ICD-10-CM

## 2022-05-23 MED ORDER — AMPHETAMINE-DEXTROAMPHETAMINE 10 MG PO TABS: 10.0000 mg | ORAL_TABLET | Freq: Every day | ORAL | 0 refills | Status: DC

## 2022-05-23 NOTE — Patient Instructions (Signed)
It was very nice to see you today!  Happy New Year!  Hope you feel better.    PLEASE NOTE:  If you had any lab tests please let us know if you have not heard back within a few days. You may see your results on MyChart before we have a chance to review them but we will give you a call once they are reviewed by [name]. If we ordered any referrals today, please let us know if you have not heard from their office within the next week.   Please try these tips to maintain a healthy lifestyle:  Eat most of your calories during the day when you are active. Eliminate processed foods including packaged sweets (pies, cakes, cookies), reduce intake of potatoes, white bread, white pasta, and white rice. Look for whole grain options, oat flour or almond flour.  Each meal should contain half fruits/vegetables, one quarter protein, and one quarter carbs (no bigger than a computer mouse).  Cut down on sweet beverages. This includes juice, soda, and sweet tea. Also watch fruit intake, though this is a healthier sweet option, it still contains natural sugar! Limit to 3 servings daily.  Drink at least 1 glass of water with each meal and aim for at least 8 glasses per day  Exercise at least 150 minutes every week.

## 2022-05-23 NOTE — Progress Notes (Signed)
MyChart Video Visit    Virtual Visit via Video Note   This visit type was conducted due to national recommendations for restrictions regarding the COVID-19 Pandemic (e.g. social distancing) in an effort to limit this patient's exposure and mitigate transmission in our community. This patient is at least at moderate risk for complications without adequate follow up. This format is felt to be most appropriate for this patient at this time. Physical exam was limited by quality of the video and audio technology used for the visit. CMA was able to get the patient set up on a video visit.  Patient location: Home. Patient and provider in visit Provider location: Office  I discussed the limitations of evaluation and management by telemedicine and the availability of in person appointments. The patient expressed understanding and agreed to proceed.  Visit Date: 05/23/2022  Today's healthcare provider: Wellington Hampshire, MD     Subjective:    Patient ID: Natalie Barker, female    DOB: May 07, 1986, 36 y.o.   MRN: [id number]  Chief Complaint  Patient presents with   Medication Refill    HPI ADD-adderall 39m bid doing well-forgetting the second dose occ but set alarm. No SE Anxiety-doing well on buspar 579mbid-psoriasis better as well since less stressed. No SI  Rsv and flu going thru household.  Pt doing ok  Past Medical History:  Diagnosis Date   Allergy    Anemia    Anxiety    Asthma    Psoriasis    Pyelonephritis    in grade school   Ulcer February 2021   Ulcerative colitis    Past Surgical History:  Procedure Laterality Date   CESAREAN SECTION N/A 02/16/2013   Procedure: CESAREAN SECTION;  Surgeon: EvThurnell LoseMD;  [facility];  Service: Obstetrics;  Laterality: N/A;   CESAREAN SECTION N/A 09/23/2016   Procedure: REPEAT CESAREAN SECTION;  Surgeon: EvThurnell LoseMD;  [facility] Service: Ob[facility]etrics;  Laterality: N/A;  EDD 09/30/2016   EYE  SURGERY     contacts surgically implanted    Outpatient Medications Prior to Visit  Medication Sig Dispense Refill   albuterol (PROVENTIL HFA;VENTOLIN HFA) 108 (90 BASE) MCG/ACT inhaler Inhale 2 puffs into the lungs every 4 (four) hours as needed for wheezing or shortness of breath.      amphetamine-dextroamphetamine (ADDERALL) 10 MG tablet Take 1 tablet (10 mg total) by mouth 2 (two) times daily. 60 tablet 0   busPIRone (BUSPAR) 5 MG tablet TAKE 1 TABLET BY MOUTH TWICE A DAY 180 tablet 1   cyanocobalamin (VITAMIN B12) 1000 MCG/ML injection Inject 1 mL (1,000 mcg total) into the muscle every 30 (thirty) days. 12 mL 1   EPINEPHrine 0.3 mg/0.3 mL IJ SOAJ injection Inject 0.3 mg into the muscle once as needed (for severe allergic reaction).      Ferrous Sulfate (IRON PO) Take by mouth.     loratadine (CLARITIN) 10 MG tablet Take 10 mg by mouth daily as needed for allergies.     Multiple Vitamin (MULTI VITAMIN DAILY PO) Take by mouth.     STELARA 90 MG/ML SOSY injection Inject into the skin.     SYRINGE-NEEDLE, DISP, 3 ML (LUER LOCK SAFETY SYRINGES) 25G X 1" 3 ML MISC 1 each by Does not apply route once a week. 10 each 3   VITAMIN D PO Take by mouth.     triamcinolone cream (KENALOG) 0.1 % Apply topically. (Patient not taking: Reported on  02/19/2022)     No facility-administered medications prior to visit.    Allergies  Allergen Reactions   Ciprofloxacin     Other reaction(s): Dizziness, severe dizziness   Iodine Anaphylaxis    Other reaction(s): anaphylaxis   Oseltamivir     Other reaction(s): Other (See Comments) Bloody diarrhea Other reaction(s): bloody diarrhea   Penicillins Hives, Shortness Of Breath, Other (See Comments) and Anaphylaxis    Has patient had a PCN reaction causing immediate rash, facial/tongue/throat swelling, SOB or lightheadedness with hypotension: Yes Has patient had a PCN reaction causing severe rash involving mucus membranes or skin necrosis: No Has patient had  a PCN reaction that required hospitalization No Has patient had a PCN reaction occurring within the last 10 years: YES If all of the above answers are "NO", then may proceed with Cephalosporin use.  Has patient had a PCN reaction causing immediate rash, facial/tongue/throat swelling, SOB or lightheadedness with hypotension: Yes  Other reaction(s): cardiac arrest/anaphlaxis   Shellfish Allergy Anaphylaxis   Shellfish-Derived Products Anaphylaxis    Other reaction(s): cardiac arrest/anaphlaxis Cardiac Arrest   Adalimumab     Other reaction(s): caused cirrhosis, Dizziness   Bactroban [Mupirocin Calcium] Hives   Eggs Or Egg-Derived Products     Other reaction(s): vomiting   Metronidazole     Other reaction(s): Dizziness, severe dizziness   Other     Other reaction(s): Vomiting Eggs   Mupirocin Hives and Rash    Other reaction(s): unknown        Objective:     Physical Exam  Vitals and nursing note reviewed.  Constitutional:      General:  is not in acute distress.    Appearance: Normal appearance.  HENT:     Head: Normocephalic.  Pulmonary:     Effort: No respiratory distress.  Skin:    General: Skin is dry.     Coloration: Skin is not pale.  Neurological:     Mental Status: Pt is alert and oriented to person, place, and time.  Psychiatric:        Mood and Affect: Mood normal.   There were no vitals taken for this visit.  Wt Readings from Last 3 Encounters:  02/19/22 200 lb 6 oz (90.9 kg)  08/23/21 203 lb 2 oz (92.1 kg)  09/23/16 193 lb (87.5 kg)       Assessment & Plan:   Problem List Items Addressed This Visit       Other   Anxiety - Primary   Attention deficit hyperactivity disorder (ADHD), predominantly inattentive type   Anxiety-chronic.  Well controlled.  Cont buspar 64m bid ADD-chronic.  Well controlled.  Cont adderall 174mbid-pdmp checked.  3  rx sent F/u 3 mo video  No orders of the defined types were placed in this encounter.   I discussed  the assessment and treatment plan with the patient. The patient was provided an opportunity to ask questions and all were answered. The patient agreed with the plan and demonstrated an understanding of the instructions.   The patient was advised to call back or seek an in-person evaluation if the symptoms worsen or if the condition fails to improve as anticipated.  I provided 12 minutes of face-to-face time during this encounter.   AnWellington HampshireMD [phone]) [phone])  [place]

## 2022-06-26 ENCOUNTER — Other Ambulatory Visit: Payer: Self-pay | Admitting: Family Medicine

## 2022-07-28 ENCOUNTER — Other Ambulatory Visit: Payer: Self-pay | Admitting: Family Medicine

## 2022-07-29 NOTE — Telephone Encounter (Signed)
Noted. You have to refuse it so it will be removed from box, will not let me do it.

## 2022-08-20 ENCOUNTER — Encounter: Payer: Self-pay | Admitting: Family Medicine

## 2022-08-20 ENCOUNTER — Telehealth (INDEPENDENT_AMBULATORY_CARE_PROVIDER_SITE_OTHER): Payer: Self-pay | Admitting: Family Medicine

## 2022-08-20 VITALS — Ht 69.0 in | Wt 185.0 lb

## 2022-08-20 DIAGNOSIS — F419 Anxiety disorder, unspecified: Secondary | ICD-10-CM

## 2022-08-20 DIAGNOSIS — E538 Deficiency of other specified B group vitamins: Secondary | ICD-10-CM

## 2022-08-20 DIAGNOSIS — F900 Attention-deficit hyperactivity disorder, predominantly inattentive type: Secondary | ICD-10-CM

## 2022-08-20 MED ORDER — BUSPIRONE HCL 5 MG PO TABS: 5.0000 mg | ORAL_TABLET | Freq: Two times a day (BID) | ORAL | 1 refills | Status: DC

## 2022-08-20 MED ORDER — AMPHETAMINE-DEXTROAMPHETAMINE 10 MG PO TABS: 10.0000 mg | ORAL_TABLET | Freq: Every day | ORAL | 0 refills | Status: DC

## 2022-08-20 NOTE — Progress Notes (Signed)
MyChart Video Visit    Virtual Visit via Video Note   This visit type was conducted. This format is felt to be most appropriate for this patient at this time. Physical exam was limited by quality of the video and audio technology used for the visit. CMA was able to get the patient set up on a video visit.  Patient location: work. Patient and provider in visit Provider location: Office  I discussed the limitations of evaluation and management by telemedicine and the availability of in person appointments. The patient expressed understanding and agreed to proceed.  Visit Date: 08/20/2022  Today's healthcare provider: Wellington Hampshire, MD     Subjective:    Patient ID: Natalie Barker, female    DOB: 08/28/1985, 37 y.o.   MRN: [id number]  Chief Complaint  Patient presents with   ADHD    Pt stated that she has been stable since she has been on both medication for Anxiety/ADHD   Anxiety    HPI ADHD-adderall 10mg  daily working  well.  Anxiety-doing well on Buspar 5mg  bid.  No SI  B12-doing better on injections-monthly.     Past Medical History:  Diagnosis Date   Allergy    Anemia    Anxiety    Asthma    Psoriasis    Pyelonephritis    in grade school   Ulcer February 2021   Ulcerative colitis    Past Surgical History:  Procedure Laterality Date   CESAREAN SECTION N/A 02/16/2013   Procedure: CESAREAN SECTION;  Surgeon: Thurnell Lose, MD;  Location: [facility];  Service: Obstetrics;  Laterality: N/A;   CESAREAN SECTION N/A 09/23/2016   Procedure: REPEAT CESAREAN SECTION;  Surgeon: Thurnell Lose, MD;  Location: [facility];  Service: Obstetrics;  Laterality: N/A;  EDD 09/30/2016   EYE SURGERY     contacts surgically implanted    Outpatient Medications Prior to Visit  Medication Sig Dispense Refill   albuterol (PROVENTIL HFA;VENTOLIN HFA) 108 (90 BASE) MCG/ACT inhaler Inhale 2 puffs into the lungs every 4 (four) hours as needed for wheezing or shortness of  breath.      amphetamine-dextroamphetamine (ADDERALL) 10 MG tablet Take 1 tablet (10 mg total) by mouth daily with breakfast. 30 tablet 0   busPIRone (BUSPAR) 5 MG tablet TAKE 1 TABLET BY MOUTH TWICE A DAY 180 tablet 1   cyanocobalamin (VITAMIN B12) 1000 MCG/ML injection Inject 1 mL (1,000 mcg total) into the muscle every 30 (thirty) days. 12 mL 1   EPINEPHrine 0.3 mg/0.3 mL IJ SOAJ injection Inject 0.3 mg into the muscle once as needed (for severe allergic reaction).      Ferrous Sulfate (IRON PO) Take by mouth.     loratadine (CLARITIN) 10 MG tablet Take 10 mg by mouth daily as needed for allergies.     Multiple Vitamin (MULTI VITAMIN DAILY PO) Take by mouth.     STELARA 90 MG/ML SOSY injection Inject into the skin.     SYRINGE-NEEDLE, DISP, 3 ML (LUER LOCK SAFETY SYRINGES) 25G X 1" 3 ML MISC 1 each by Does not apply route once a week. 10 each 3   VITAMIN D PO Take by mouth.     amphetamine-dextroamphetamine (ADDERALL) 10 MG tablet Take 1 tablet (10 mg total) by mouth daily with breakfast. 30 tablet 0   amphetamine-dextroamphetamine (ADDERALL) 10 MG tablet Take 1 tablet (10 mg total) by mouth daily with breakfast. 30 tablet 0   No facility-administered medications prior to visit.  Allergies  Allergen Reactions   Ciprofloxacin     Other reaction(s): Dizziness, severe dizziness   Iodine Anaphylaxis    Other reaction(s): anaphylaxis   Oseltamivir     Other reaction(s): Other (See Comments) Bloody diarrhea Other reaction(s): bloody diarrhea   Penicillins Hives, Shortness Of Breath, Other (See Comments) and Anaphylaxis    Has patient had a PCN reaction causing immediate rash, facial/tongue/throat swelling, SOB or lightheadedness with hypotension: Yes Has patient had a PCN reaction causing severe rash involving mucus membranes or skin necrosis: No Has patient had a PCN reaction that required hospitalization No Has patient had a PCN reaction occurring within the last 10 years: YES If all  of the above answers are "NO", then may proceed with Cephalosporin use.  Has patient had a PCN reaction causing immediate rash, facial/tongue/throat swelling, SOB or lightheadedness with hypotension: Yes  Other reaction(s): cardiac arrest/anaphlaxis   Shellfish Allergy Anaphylaxis   Shellfish-Derived Products Anaphylaxis    Other reaction(s): cardiac arrest/anaphlaxis Cardiac Arrest   Adalimumab     Other reaction(s): caused cirrhosis, Dizziness   Bactroban [Mupirocin Calcium] Hives   Egg-Derived Products     Other reaction(s): vomiting   Metronidazole     Other reaction(s): Dizziness, severe dizziness   Other     Other reaction(s): Vomiting Eggs   Mupirocin Hives and Rash    Other reaction(s): unknown        Objective:     Physical Exam  Vitals and nursing note reviewed.  Constitutional:      General:  is not in acute distress.    Appearance: Normal appearance.  HENT:     Head: Normocephalic.  Pulmonary:     Effort: No respiratory distress.  Skin:    General: Skin is dry.     Coloration: Skin is not pale.  Neurological:     Mental Status: Pt is alert and oriented to person, place, and time.  Psychiatric:        Mood and Affect: Mood normal.   Ht 5\' 9"  (1.753 m)   Wt 185 lb (83.9 kg)   BMI 27.32 kg/m   Wt Readings from Last 3 Encounters:  08/20/22 185 lb (83.9 kg)  02/19/22 200 lb 6 oz (90.9 kg)  08/23/21 203 lb 2 oz (92.1 kg)       Assessment & Plan:   Problem List Items Addressed This Visit       Other   Vitamin B 12 deficiency   Anxiety   Attention deficit hyperactivity disorder (ADHD), predominantly inattentive type - Primary   ADHD-chronic.  Doing well on adderall 10mg .  Continue.  3 rx sent.  Pdmp checked Anxiety-chronic.  Well controlled.  Cont Buspar 5mg  bid B12 deficiency-chronic.  Doing well on injuections.  Continue.  F/u 3 mo-f-f or virtual ok.   No orders of the defined types were placed in this encounter.   I discussed the  assessment and treatment plan with the patient. The patient was provided an opportunity to ask questions and all were answered. The patient agreed with the plan and demonstrated an understanding of the instructions.   The patient was advised to call back or seek an in-person evaluation if the symptoms worsen or if the condition fails to improve as anticipated.     Wellington Hampshire, MD [place] [phone] (phone) [phone] (fax)  [place]

## 2022-08-20 NOTE — Patient Instructions (Signed)
It was very nice to see you today!  Renewed meds.    PLEASE NOTE:  If you had any lab tests please let us know if you have not heard back within a few days. You may see your results on MyChart before we have a chance to review them but we will give you a call once they are reviewed by [name]. If we ordered any referrals today, please let us know if you have not heard from their office within the next week.   Please try these tips to maintain a healthy lifestyle:  Eat most of your calories during the day when you are active. Eliminate processed foods including packaged sweets (pies, cakes, cookies), reduce intake of potatoes, white bread, white pasta, and white rice. Look for whole grain options, oat flour or almond flour.  Each meal should contain half fruits/vegetables, one quarter protein, and one quarter carbs (no bigger than a computer mouse).  Cut down on sweet beverages. This includes juice, soda, and sweet tea. Also watch fruit intake, though this is a healthier sweet option, it still contains natural sugar! Limit to 3 servings daily.  Drink at least 1 glass of water with each meal and aim for at least 8 glasses per day  Exercise at least 150 minutes every week.

## 2022-10-12 ENCOUNTER — Other Ambulatory Visit: Payer: Self-pay | Admitting: Family Medicine

## 2022-10-13 ENCOUNTER — Encounter: Payer: Self-pay | Admitting: Family Medicine

## 2022-12-01 IMAGING — CT CT ABD-PELV W/ CM
2 of 4 series · 16 of 46 positions shown, 18 images · IV contrast (OMNIPAQUE)
Comparison: None.

CLINICAL DATA: 34-year-old female 6 with rectal bleeding. History
of ulcerative colitis.

EXAM:
CT ABDOMEN AND PELVIS WITH CONTRAST
TECHNIQUE: Multidetector CT imaging of the abdomen and pelvis was performed
using the standard protocol following bolus administration of
intravenous contrast.
CONTRAST:  100mL OMNIPAQUE IOHEXOL 300 MG/ML  SOLN

[Series 2: axial st · axial · 0.74mm/px · z∈[-581,-171]mm · 13 of 92 slices shown, 15 images]
[im 5/92  soft-tissue]
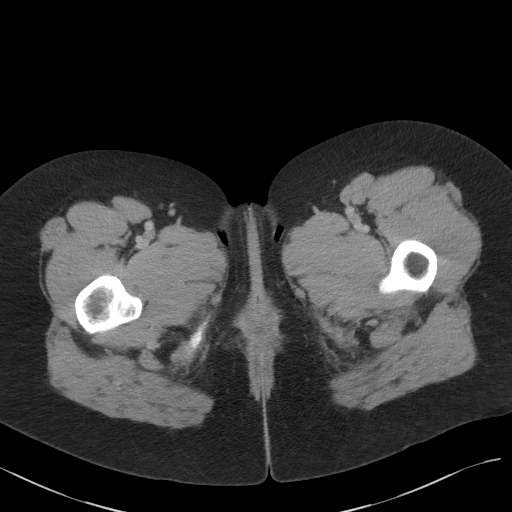
[im 5/92  bone]
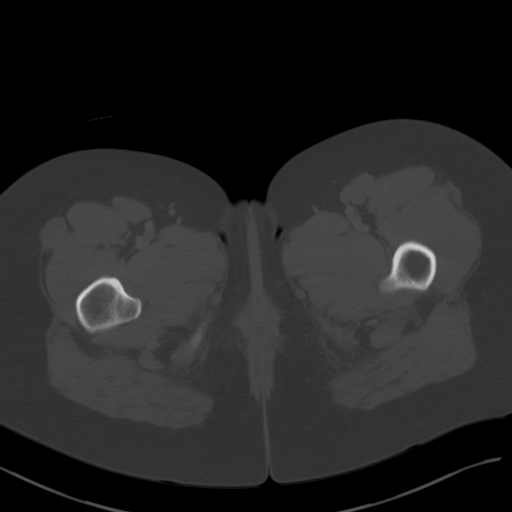
[im 15/92  soft-tissue]
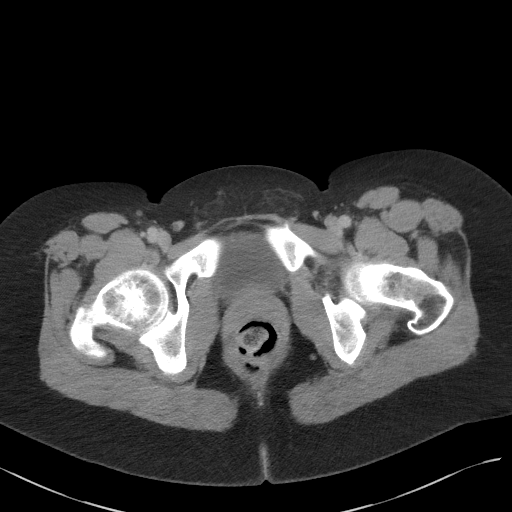
[im 20/92  soft-tissue]
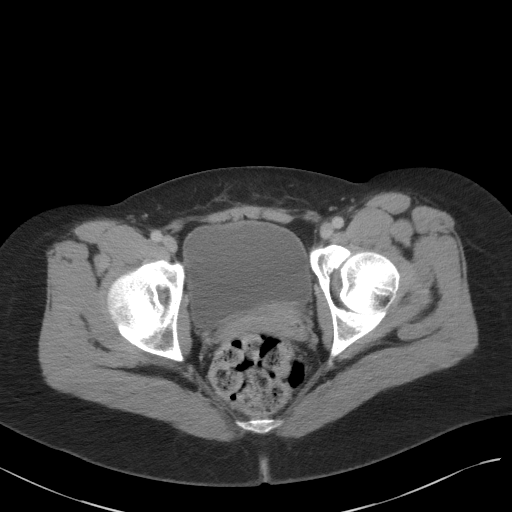
[im 24/92  soft-tissue]
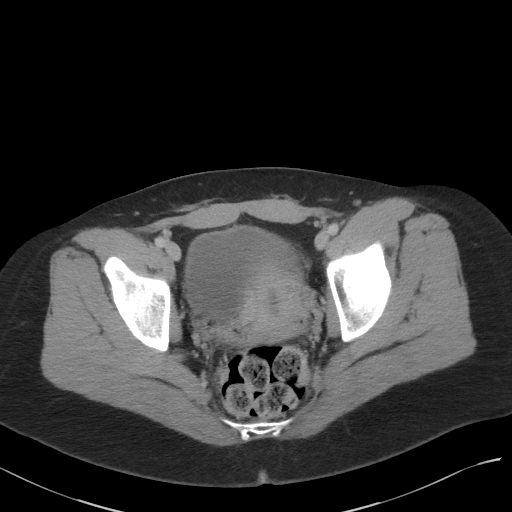
[im 34/92  soft-tissue]
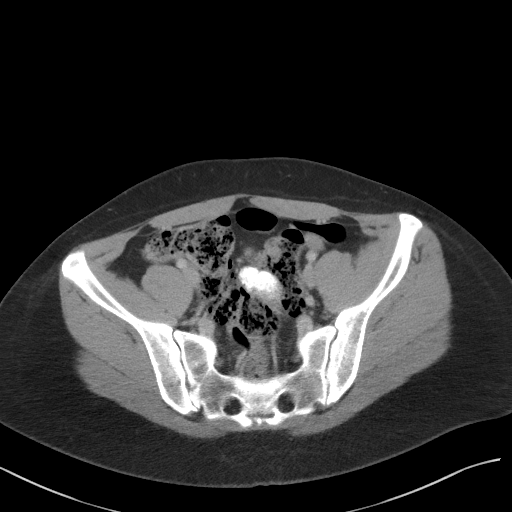
[im 39/92  soft-tissue]
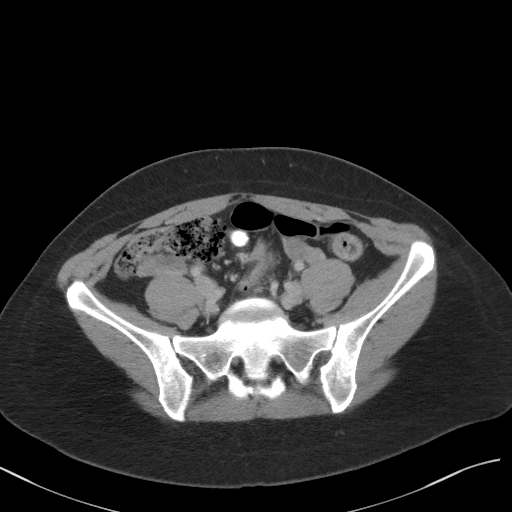
[im 48/92  soft-tissue]
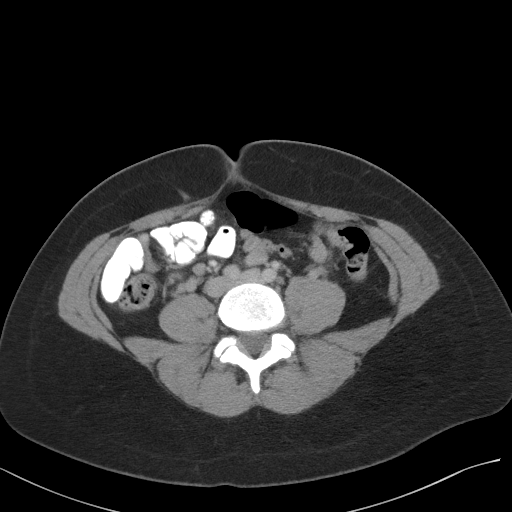
[im 53/92  soft-tissue]
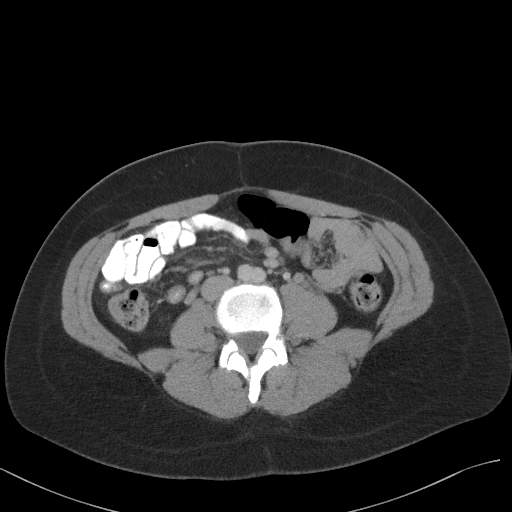
[im 58/92  soft-tissue]
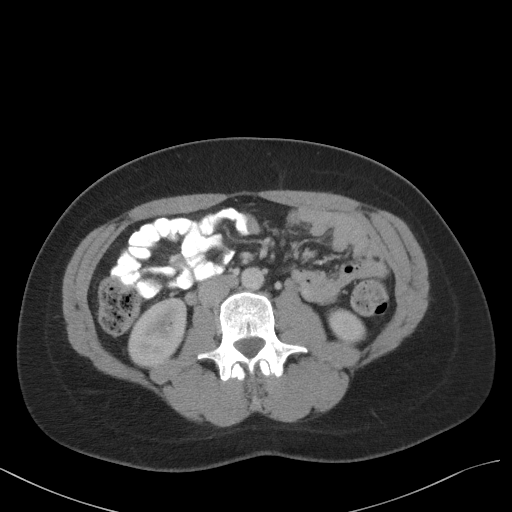
[im 58/92  bone]
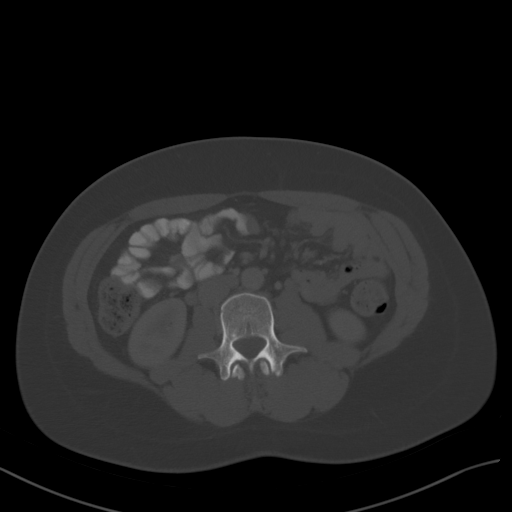
[im 68/92  soft-tissue]
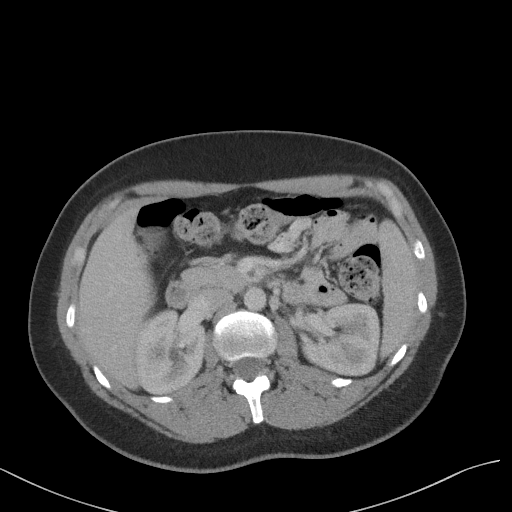
[im 72/92  soft-tissue]
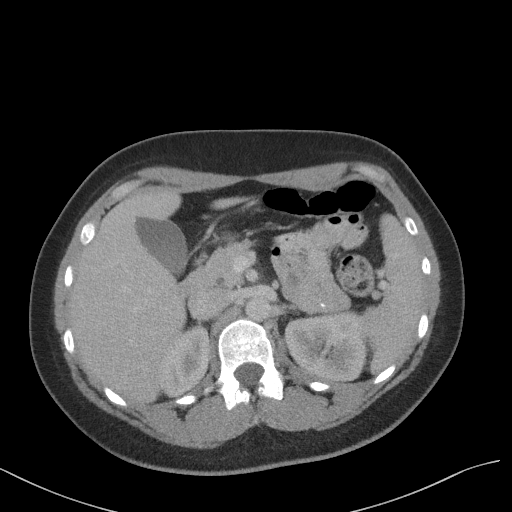
[im 77/92  soft-tissue]
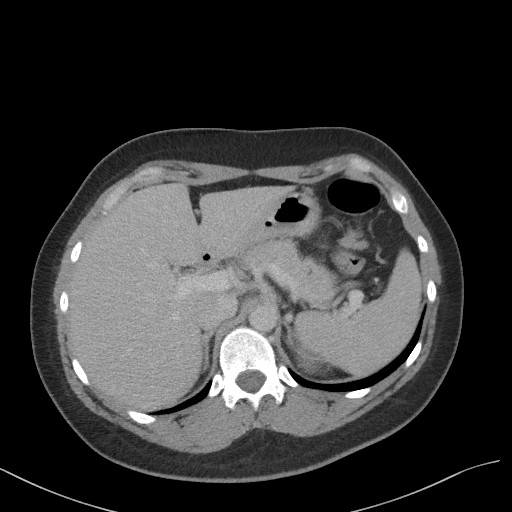
[im 87/92  soft-tissue]
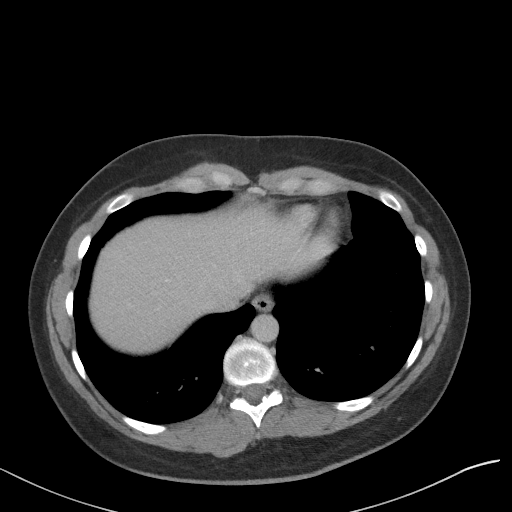

[Series 4: coronal st · coronal · 0.88mm/px · 3 of 71 slices shown]
[im 24/71  soft-tissue]
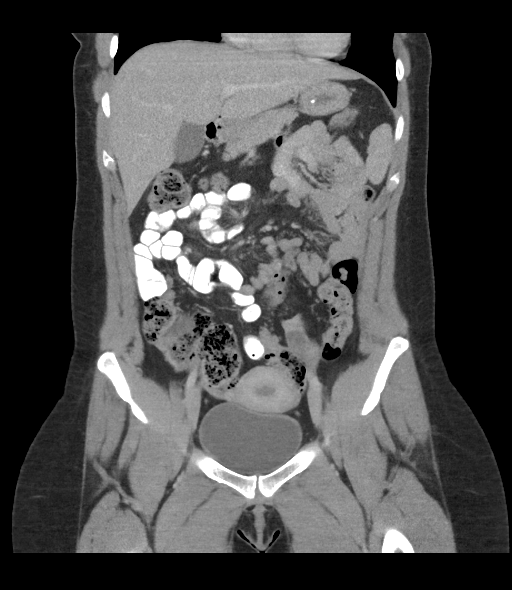
[im 32/71  soft-tissue]
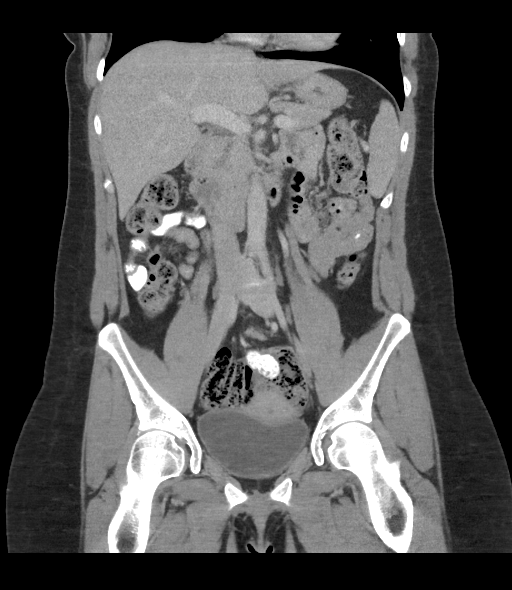
[im 39/71  soft-tissue]
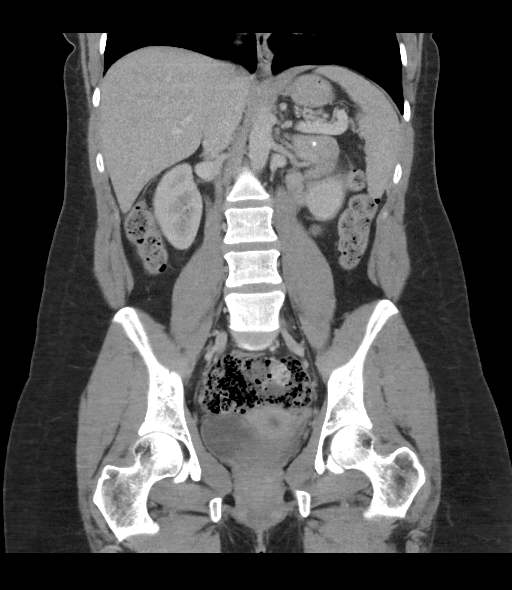

[16 of 46 positions shown; findings below may reference images not displayed]

FINDINGS: Lower chest: The visualized lung bases are clear.

No intra-abdominal free air or free fluid.

Hepatobiliary: Apparent mild fatty liver. No intrahepatic biliary
dilatation. The gallbladder is unremarkable.

Pancreas: Unremarkable. No pancreatic ductal dilatation or
surrounding inflammatory changes.

Spleen: Normal in size without focal abnormality.

Adrenals/Urinary Tract: The adrenal glands, kidneys, visualized
ureters, and urinary bladder appear unremarkable.

Stomach/Bowel: Moderate stool throughout the colon. There is no
bowel obstruction or active inflammation. The appendix is normal.

Vascular/Lymphatic: The abdominal aorta and IVC are unremarkable. No
portal venous gas. There is no adenopathy.

Reproductive: The uterus is anteverted and grossly unremarkable. No
adnexal masses.

Other: Small fat containing umbilical hernia.

Musculoskeletal: No acute or significant osseous findings.
IMPRESSION: No acute intra-abdominal or pelvic pathology. No bowel obstruction.
Normal appendix.

## 2023-02-16 ENCOUNTER — Telehealth: Payer: Self-pay | Admitting: Family Medicine

## 2023-02-16 NOTE — Telephone Encounter (Signed)
LVM for pt to call back. Needs triaged for symptoms of blurred vision, VERY frequent urination, exhaustion, and extreme thirst.

## 2023-02-18 NOTE — Telephone Encounter (Signed)
Pt requested an appt with symptoms listed in previous msg. I have left 3 msgs & sent MyChart msg. No response. Will not schedule until have spoken to pt.

## 2023-02-27 ENCOUNTER — Encounter: Payer: Self-pay | Admitting: Family

## 2023-02-27 ENCOUNTER — Ambulatory Visit (INDEPENDENT_AMBULATORY_CARE_PROVIDER_SITE_OTHER): Payer: 59 | Admitting: Family

## 2023-02-27 VITALS — BP 114/74 | HR 64 | Temp 97.8°F | Ht 69.0 in | Wt 185.4 lb

## 2023-02-27 DIAGNOSIS — E161 Other hypoglycemia: Secondary | ICD-10-CM

## 2023-02-27 DIAGNOSIS — R5383 Other fatigue: Secondary | ICD-10-CM

## 2023-02-27 DIAGNOSIS — Z833 Family history of diabetes mellitus: Secondary | ICD-10-CM

## 2023-02-27 NOTE — Progress Notes (Signed)
Patient ID: Natalie Barker, female    DOB: 1985-07-13, 37 y.o.   MRN: [id number]  Chief Complaint  Patient presents with   Blurred Vision    Pt c/o Blurry vision, dizziness, excessive urination, fatigue and increased thirst. Present for 2-3 weeks. Pt would like to discuss insulin resistance.    *Discussed the use of AI scribe software for clinical note transcription with the patient, who gave verbal consent to proceed.  History of Present Illness   The patient, with a history of ulcerative colitis, presents with new onset symptoms of increased thirst, blurry vision, and fatigue over the past few weeks. The patient reports drinking almost double the usual amount of water due to increased thirst. The patient also reports blurry vision, which is unusual given her history of corrective eye surgery. The patient has noticed these symptoms typically occur an hour or two after eating. The patient has a family history of diabetes, with her mother having had gestational diabetes and being borderline diabetic, and a strong history of diabetes on the maternal side of the family. The patient also has a family history of thyroid cancer, with both her mother and maternal grandmother having had the disease. The patient is currently taking Vitamin D supplements and has previously received B12 injections.      Assessment & Plan:   Possible T2DM - New onset polydipsia, polyuria, and blurry vision. Family history of diabetes. No urinary symptoms or blood in urine. Weight is down over the last year. -Order A1C, fasting blood glucose, and insulin levels to assess for possible diabetes. -If diabetes is ruled out, consider follow-up with ophthalmologist.  Ulcerative Colitis - Reports dietary modifications due to certain foods exacerbating symptoms. -Continue current management plan.  Fatigue -  Currently taking over-the-counter Vitamin D. -Check Vitamin D levels to assess adequacy of current  supplementation. -Check B12 level.  Last injection approximately two months ago. -Complete blood count to assess for anemia. -Check electrolytes and kidney function. -Consider autoimmune panel if symptoms persist.      Subjective:    Outpatient Medications Prior to Visit  Medication Sig Dispense Refill   albuterol (PROVENTIL HFA;VENTOLIN HFA) 108 (90 BASE) MCG/ACT inhaler Inhale 2 puffs into the lungs every 4 (four) hours as needed for wheezing or shortness of breath.      busPIRone (BUSPAR) 5 MG tablet Take 1 tablet (5 mg total) by mouth 2 (two) times daily. 180 tablet 1   EPINEPHrine 0.3 mg/0.3 mL IJ SOAJ injection Inject 0.3 mg into the muscle once as needed (for severe allergic reaction).      Ferrous Sulfate (IRON PO) Take by mouth.     loratadine (CLARITIN) 10 MG tablet Take 10 mg by mouth daily as needed for allergies.     Multiple Vitamin (MULTI VITAMIN DAILY PO) Take by mouth.     STELARA 90 MG/ML SOSY injection Inject into the skin.     SYRINGE-NEEDLE, DISP, 3 ML (LUER LOCK SAFETY SYRINGES) 25G X 1" 3 ML MISC 1 each by Does not apply route once a week. 10 each 3   VITAMIN D PO Take by mouth.     amphetamine-dextroamphetamine (ADDERALL) 10 MG tablet Take 1 tablet (10 mg total) by mouth daily with breakfast. 30 tablet 0   amphetamine-dextroamphetamine (ADDERALL) 10 MG tablet Take 1 tablet (10 mg total) by mouth daily with breakfast. 30 tablet 0   amphetamine-dextroamphetamine (ADDERALL) 10 MG tablet Take 1 tablet (10 mg total) by mouth daily with breakfast. 30  tablet 0   amphetamine-dextroamphetamine (ADDERALL) 10 MG tablet Take 1 tablet (10 mg total) by mouth daily with breakfast. 30 tablet 0   amphetamine-dextroamphetamine (ADDERALL) 10 MG tablet Take 1 tablet (10 mg total) by mouth daily with breakfast. 30 tablet 0   cyanocobalamin (VITAMIN B12) 1000 MCG/ML injection Inject 1 mL (1,000 mcg total) into the muscle every 30 (thirty) days. (Patient not taking: Reported on 02/27/2023)  12 mL 1   No facility-administered medications prior to visit.   Past Medical History:  Diagnosis Date   Allergy    Anemia    Anxiety    Asthma    Psoriasis    Pyelonephritis    in grade school   Ulcer February 2021   Ulcerative colitis   Past Surgical History:  Procedure Laterality Date   CESAREAN SECTION N/A 02/16/2013   Procedure: CESAREAN SECTION;  Surgeon: Geryl Rankins, MD;  Location: [facility];  Service: Obstetrics;  Laterality: N/A;   CESAREAN SECTION N/A 09/23/2016   Procedure: REPEAT CESAREAN SECTION;  Surgeon: Geryl Rankins, MD;  Location: [facility];  Service: Obstetrics;  Laterality: N/A;  EDD 09/30/2016   EYE SURGERY     contacts surgically implanted   Allergies  Allergen Reactions   Ciprofloxacin     Other reaction(s): Dizziness, severe dizziness   Iodine Anaphylaxis    Other reaction(s): anaphylaxis   Oseltamivir     Other reaction(s): Other (See Comments) Bloody diarrhea Other reaction(s): bloody diarrhea   Penicillins Hives, Shortness Of Breath, Other (See Comments) and Anaphylaxis    Has patient had a PCN reaction causing immediate rash, facial/tongue/throat swelling, SOB or lightheadedness with hypotension: Yes Has patient had a PCN reaction causing severe rash involving mucus membranes or skin necrosis: No Has patient had a PCN reaction that required hospitalization No Has patient had a PCN reaction occurring within the last 10 years: YES If all of the above answers are "NO", then may proceed with Cephalosporin use.  Has patient had a PCN reaction causing immediate rash, facial/tongue/throat swelling, SOB or lightheadedness with hypotension: Yes  Other reaction(s): cardiac arrest/anaphlaxis   Shellfish Allergy Anaphylaxis   Shellfish-Derived Products Anaphylaxis    Other reaction(s): cardiac arrest/anaphlaxis Cardiac Arrest   Adalimumab     Other reaction(s): caused cirrhosis, Dizziness   Bactroban [Mupirocin Calcium] Hives   Egg-Derived  Products     Other reaction(s): vomiting   Metronidazole     Other reaction(s): Dizziness, severe dizziness   Other     Other reaction(s): Vomiting Eggs   Mupirocin Hives and Rash    Other reaction(s): unknown      Objective:    Physical Exam Vitals and nursing note reviewed.  Constitutional:      Appearance: Normal appearance.  Cardiovascular:     Rate and Rhythm: Normal rate and regular rhythm.  Pulmonary:     Effort: Pulmonary effort is normal.     Breath sounds: Normal breath sounds.  Musculoskeletal:        General: Normal range of motion.  Skin:    General: Skin is warm and dry.  Neurological:     Mental Status: She is alert.  Psychiatric:        Mood and Affect: Mood normal.        Behavior: Behavior normal.   BP 114/74 (BP Location: Left Arm, Patient Position: Sitting, Cuff Size: Normal)   Pulse 64   Temp 97.8 F (36.6 C) (Temporal)   Ht 5\' 9"  (1.753 m)   Wt  185 lb 6 oz (84.1 kg)   LMP 02/25/2023 (Exact Date)   SpO2 99%   BMI 27.38 kg/m  Wt Readings from Last 3 Encounters:  02/27/23 185 lb 6 oz (84.1 kg)  08/20/22 185 lb (83.9 kg)  02/19/22 200 lb 6 oz (90.9 kg)      Dulce Sellar, NP

## 2023-03-02 LAB — BASIC METABOLIC PANEL
BUN: 12 mg/dL (ref 7–25)
CO2: 25 mmol/L (ref 20–32)
Calcium: 9 mg/dL (ref 8.6–10.2)
Chloride: 105 mmol/L (ref 98–110)
Creat: 0.69 mg/dL (ref 0.50–0.97)
Glucose, Bld: 85 mg/dL (ref 65–99)
Potassium: 3.7 mmol/L (ref 3.5–5.3)
Sodium: 138 mmol/L (ref 135–146)

## 2023-03-02 LAB — CBC WITH DIFFERENTIAL/PLATELET
Absolute Monocytes: 476 {cells}/uL (ref 200–950)
Basophils Absolute: 57 {cells}/uL (ref 0–200)
Basophils Relative: 0.7 %
Eosinophils Absolute: 262 {cells}/uL (ref 15–500)
Eosinophils Relative: 3.2 %
HCT: 37.6 % (ref 35.0–45.0)
Hemoglobin: 12 g/dL (ref 11.7–15.5)
Lymphs Abs: 2001 {cells}/uL (ref 850–3900)
MCH: 27.9 pg (ref 27.0–33.0)
MCHC: 31.9 g/dL — ABNORMAL LOW (ref 32.0–36.0)
MCV: 87.4 fL (ref 80.0–100.0)
MPV: 9.9 fL (ref 7.5–12.5)
Monocytes Relative: 5.8 %
Neutro Abs: 5404 {cells}/uL (ref 1500–7800)
Neutrophils Relative %: 65.9 %
Platelets: 320 10*3/uL (ref 140–400)
RBC: 4.3 10*6/uL (ref 3.80–5.10)
RDW: 13 % (ref 11.0–15.0)
Total Lymphocyte: 24.4 %
WBC: 8.2 10*3/uL (ref 3.8–10.8)

## 2023-03-02 LAB — VITAMIN D 25 HYDROXY (VIT D DEFICIENCY, FRACTURES): Vit D, 25-Hydroxy: 36 ng/mL (ref 30–100)

## 2023-03-02 LAB — HEMOGLOBIN A1C
Hgb A1c MFr Bld: 5.6 %{Hb} (ref <5.7)
Mean Plasma Glucose: 114 mg/dL
eAG (mmol/L): 6.3 mmol/L

## 2023-03-02 LAB — VITAMIN B12: Vitamin B-12: 376 pg/mL (ref 200–1100)

## 2023-03-02 LAB — TSH: TSH: 1.31 m[IU]/L

## 2023-03-02 LAB — INSULIN, RANDOM: Insulin: 26.9 u[IU]/mL — ABNORMAL HIGH

## 2023-03-03 NOTE — Addendum Note (Signed)
Addended byDulce Sellar on: 03/03/2023 10:52 PM   Modules accepted: Orders

## 2023-03-09 ENCOUNTER — Other Ambulatory Visit (INDEPENDENT_AMBULATORY_CARE_PROVIDER_SITE_OTHER): Payer: 59

## 2023-03-09 ENCOUNTER — Other Ambulatory Visit: Payer: Self-pay | Admitting: Family

## 2023-03-09 DIAGNOSIS — E161 Other hypoglycemia: Secondary | ICD-10-CM

## 2023-03-10 ENCOUNTER — Other Ambulatory Visit: Payer: Self-pay

## 2023-03-10 DIAGNOSIS — E161 Other hypoglycemia: Secondary | ICD-10-CM

## 2023-03-11 LAB — GLUCOSE, FASTING: Glucose, Bld: 89 mg/dL (ref 65–99)

## 2023-03-12 ENCOUNTER — Encounter: Payer: Self-pay | Admitting: Family Medicine

## 2023-03-12 ENCOUNTER — Telehealth: Payer: Self-pay | Admitting: Family Medicine

## 2023-03-12 NOTE — Telephone Encounter (Signed)
Patient requests to be called with Lab results

## 2023-03-12 NOTE — Telephone Encounter (Signed)
Pt calling for lab results.

## 2023-03-13 NOTE — Progress Notes (Signed)
ask lab - there was a fasting insulin with this also, but don't see results?

## 2023-03-13 NOTE — Telephone Encounter (Signed)
sent separate message to ask lab about the fasting insulin result, thx

## 2023-03-16 ENCOUNTER — Other Ambulatory Visit: Payer: Self-pay | Admitting: Family Medicine

## 2023-03-16 NOTE — Telephone Encounter (Signed)
Patient has been scheduled in office visit on 03/20/23 @ 3:30 pm.

## 2023-03-16 NOTE — Telephone Encounter (Signed)
Needs appt fin person

## 2023-03-19 LAB — INSULIN, FREE (BIOACTIVE): Insulin, Free: 5.6 u[IU]/mL (ref 1.5–14.9)

## 2023-03-20 ENCOUNTER — Telehealth: Payer: Self-pay | Admitting: Family Medicine

## 2023-03-20 ENCOUNTER — Ambulatory Visit (INDEPENDENT_AMBULATORY_CARE_PROVIDER_SITE_OTHER): Payer: 59 | Admitting: Family Medicine

## 2023-03-20 ENCOUNTER — Other Ambulatory Visit: Payer: Self-pay | Admitting: *Deleted

## 2023-03-20 ENCOUNTER — Encounter: Payer: Self-pay | Admitting: Family Medicine

## 2023-03-20 VITALS — BP 102/70 | HR 81 | Temp 98.2°F | Resp 18 | Ht 69.0 in | Wt 187.5 lb

## 2023-03-20 DIAGNOSIS — Z79899 Other long term (current) drug therapy: Secondary | ICD-10-CM | POA: Diagnosis not present

## 2023-03-20 DIAGNOSIS — F900 Attention-deficit hyperactivity disorder, predominantly inattentive type: Secondary | ICD-10-CM | POA: Diagnosis not present

## 2023-03-20 DIAGNOSIS — F419 Anxiety disorder, unspecified: Secondary | ICD-10-CM

## 2023-03-20 DIAGNOSIS — Z833 Family history of diabetes mellitus: Secondary | ICD-10-CM | POA: Diagnosis not present

## 2023-03-20 DIAGNOSIS — R631 Polydipsia: Secondary | ICD-10-CM

## 2023-03-20 MED ORDER — AMPHETAMINE-DEXTROAMPHETAMINE 10 MG PO TABS: 10.0000 mg | ORAL_TABLET | Freq: Every day | ORAL | 0 refills | Status: DC

## 2023-03-20 MED ORDER — FREESTYLE LIBRE 3 PLUS SENSOR MISC: 2 refills | Status: DC

## 2023-03-20 MED ORDER — BUSPIRONE HCL 5 MG PO TABS: 5.0000 mg | ORAL_TABLET | Freq: Two times a day (BID) | ORAL | 1 refills | Status: AC

## 2023-03-20 NOTE — Patient Instructions (Signed)
It was very nice to see you today!  Get glucometer or 24 hr one.  Do before meal-or 2 hrs ago   PLEASE NOTE:  If you had any lab tests please let us know if you have not heard back within a few days. You may see your results on MyChart before we have a chance to review them but we will give you a call once they are reviewed by [name]. If we ordered any referrals today, please let us know if you have not heard from their office within the next week.   Please try these tips to maintain a healthy lifestyle:  Eat most of your calories during the day when you are active. Eliminate processed foods including packaged sweets (pies, cakes, cookies), reduce intake of potatoes, white bread, white pasta, and white rice. Look for whole grain options, oat flour or almond flour.  Each meal should contain half fruits/vegetables, one quarter protein, and one quarter carbs (no bigger than a computer mouse).  Cut down on sweet beverages. This includes juice, soda, and sweet tea. Also watch fruit intake, though this is a healthier sweet option, it still contains natural sugar! Limit to 3 servings daily.  Drink at least 1 glass of water with each meal and aim for at least 8 glasses per day  Exercise at least 150 minutes every week.

## 2023-03-20 NOTE — Telephone Encounter (Signed)
Rx sent to the pharmacy.

## 2023-03-20 NOTE — Telephone Encounter (Signed)
Patient requests RX for Glucometer (per Pharmacy RX is required) be sent to:  [id number] - [place], [place] - [address] Phone: [phone]  Fax: [phone]

## 2023-03-20 NOTE — Progress Notes (Signed)
Subjective:     Patient ID: Natalie Barker, female    DOB: 21-Mar-1986, 37 y.o.   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   ADHD/anxiety - Taking Adderall 10 mg daily and Buspar 5 mg as needed. Doesn't tend to take Buspar on weekends, stating she usually need to take it to help her while at work.  No SI  Ulcerative pancolitis - Taking Stelara. She reports having blood work done on 9/30. She was instructed to return for a fasting insulin and glucose labs due to insulin levels being high. She endorses frequent urination, excessive thirst, and blurry vision after a meal starting about 1 to 1.5 months ago. She drinks plenty water daily, drinking about 2-3.5 bottles of Stanley water bottles. Of note, she has had vision correction surgery. She denies hx of gestational diabetes, but states she was on the low border. Has positive fmhx of diabetes and preDM in family. Her mom had gestational diabetes and currently has preDM.    Health Maintenance Due  Topic Date Due   Cervical Cancer Screening (HPV/Pap Cotest)  03/06/2019    Past Medical History:  Diagnosis Date   Allergy    Anemia    Anxiety    Asthma    Psoriasis    Pyelonephritis    in grade school   Ulcer February 2021   Ulcerative colitis    Past Surgical History:  Procedure Laterality Date   CESAREAN SECTION N/A 02/16/2013   Procedure: CESAREAN SECTION;  Surgeon: Geryl Rankins, MD;  Location: [facility];  Service: Obstetrics;  Laterality: N/A;   CESAREAN SECTION N/A 09/23/2016   Procedure: REPEAT CESAREAN SECTION;  Surgeon: Geryl Rankins, MD;  Location: [facility] SUIT[facility];  Service: Obstetrics;  Laterality: N/A;  EDD 09/30/2016   EYE SURGERY     contacts surgically implanted     Current Outpatient Medications:    albuterol (PROVENTIL HFA;VENTOLIN HFA) 108 (90 BASE) MCG/ACT  inhaler, Inhale 2 puffs into the lungs every 4 (four) hours as needed for wheezing or shortness of breath. , Disp: , Rfl:    amphetamine-dextroamphetamine (ADDERALL) 10 MG tablet, Take 1 tablet (10 mg total) by mouth daily with breakfast., Disp: 30 tablet, Rfl: 0   amphetamine-dextroamphetamine (ADDERALL) 10 MG tablet, Take 1 tablet (10 mg total) by mouth daily with breakfast., Disp: 30 tablet, Rfl: 0   [START ON 04/19/2023] amphetamine-dextroamphetamine (ADDERALL) 10 MG tablet, Take 1 tablet (10 mg total) by mouth daily with breakfast., Disp: 30 tablet, Rfl: 0   [START ON 05/19/2023] amphetamine-dextroamphetamine (ADDERALL) 10 MG tablet, Take 1 tablet (10 mg total) by mouth daily with breakfast., Disp: 30 tablet, Rfl: 0   EPINEPHrine 0.3 mg/0.3 mL IJ SOAJ injection, Inject 0.3 mg into the muscle once as needed (for severe allergic reaction). , Disp: , Rfl:    Ferrous Sulfate (IRON PO), Take by mouth., Disp: , Rfl:    loratadine (CLARITIN) 10 MG tablet, Take 10 mg by mouth daily as needed for allergies., Disp: , Rfl:    Multiple Vitamin (MULTI VITAMIN DAILY PO), Take by mouth., Disp: , Rfl:    ustekinumab (STELARA) 90 MG/ML SOSY injection, Inject into the skin., Disp: , Rfl:    VITAMIN D PO, Take by mouth., Disp: , Rfl:    busPIRone (BUSPAR) 5 MG tablet, Take 1 tablet (5 mg total)  by mouth 2 (two) times daily., Disp: 180 tablet, Rfl: 1   Continuous Glucose Sensor (FREESTYLE LIBRE 3 PLUS SENSOR) MISC, Change sensor every 15 days., Disp: 2 each, Rfl: 2   cyanocobalamin (VITAMIN B12) 1000 MCG/ML injection, Inject 1 mL (1,000 mcg total) into the muscle every 30 (thirty) days. (Patient not taking: Reported on 02/27/2023), Disp: 12 mL, Rfl: 1   SYRINGE-NEEDLE, DISP, 3 ML (LUER LOCK SAFETY SYRINGES) 25G X 1" 3 ML MISC, 1 each by Does not apply route once a week. (Patient not taking: Reported on 03/20/2023), Disp: 10 each, Rfl: 3  Allergies  Allergen Reactions   Ciprofloxacin     Other reaction(s):  Dizziness, severe dizziness   Iodine Anaphylaxis    Other reaction(s): anaphylaxis   Oseltamivir     Other reaction(s): Other (See Comments) Bloody diarrhea Other reaction(s): bloody diarrhea   Penicillins Hives, Shortness Of Breath, Other (See Comments) and Anaphylaxis    Has patient had a PCN reaction causing immediate rash, facial/tongue/throat swelling, SOB or lightheadedness with hypotension: Yes Has patient had a PCN reaction causing severe rash involving mucus membranes or skin necrosis: No Has patient had a PCN reaction that required hospitalization No Has patient had a PCN reaction occurring within the last 10 years: YES If all of the above answers are "NO", then may proceed with Cephalosporin use.  Has patient had a PCN reaction causing immediate rash, facial/tongue/throat swelling, SOB or lightheadedness with hypotension: Yes  Other reaction(s): cardiac arrest/anaphlaxis   Shellfish Allergy Anaphylaxis   Shellfish-Derived Products Anaphylaxis    Other reaction(s): cardiac arrest/anaphlaxis Cardiac Arrest   Adalimumab     Other reaction(s): caused cirrhosis, Dizziness   Bactroban [Mupirocin Calcium] Hives   Egg-Derived Products     Other reaction(s): vomiting   Metronidazole     Other reaction(s): Dizziness, severe dizziness   Other     Other reaction(s): Vomiting Eggs   Mupirocin Hives and Rash    Other reaction(s): unknown   ROS neg/noncontributory except as noted HPI/below      Objective:     BP 102/70   Pulse 81   Temp 98.2 F (36.8 C) (Temporal)   Resp 18   Ht 5\' 9"  (1.753 m)   Wt 187 lb 8 oz (85 kg)   LMP 02/25/2023 (Exact Date)   SpO2 98%   BMI 27.69 kg/m  Wt Readings from Last 3 Encounters:  03/20/23 187 lb 8 oz (85 kg)  02/27/23 185 lb 6 oz (84.1 kg)  08/20/22 185 lb (83.9 kg)    Physical Exam   Gen: WDWN NAD HEENT: NCAT, conjunctiva not injected, sclera nonicteric. TM WNL B, +OP slightly dry, no exudates.  NECK:  supple, no  thyromegaly, no nodes, no carotid bruits CARDIAC: RRR, S1S2+, no murmur. DP 2+B LUNGS: CTAB. No wheezes ABDOMEN:  BS+, soft, NTND, No HSM, no masses EXT:  no edema MSK: no gross abnormalities.  NEURO: A&O x3.  CN II-XII intact.  PSYCH: normal mood. Good eye contact   Reviewed labs-random insulin high at 26.  Sugars <100 Fasting insulin free 5.6(1.5-14.9)    Assessment & Plan:  Attention deficit hyperactivity disorder (ADHD), predominantly inattentive type Assessment & Plan: Chronic.  Controlled w/adderall 10mg  q am.  Pdmp checked.  3 rx's sent to pharm-message in 3 mo for 3 month supply.  Needs at least a virtual visit in 6 months or can't refill.  Needs in person visit at least yearly and UDS or can't continue to rx  Orders: -     DRUG MONITORING, PANEL 8 WITH CONFIRMATION, URINE  Anxiety Assessment & Plan: Chronic.  Controlloed.  Continue BusPar 5mg  bid prn  Orders: -     busPIRone HCl; Take 1 tablet (5 mg total) by mouth 2 (two) times daily.  Dispense: 180 tablet; Refill: 1  High risk medication use -     DRUG MONITORING, PANEL 8 WITH CONFIRMATION, URINE  Family history of diabetes mellitus (DM) Assessment & Plan: Pt at risk  work on diet/exercise.  Insulin random high.  Free is normal.   Excessive thirst  Other orders -     Amphetamine-Dextroamphetamine; Take 1 tablet (10 mg total) by mouth daily with breakfast.  Dispense: 30 tablet; Refill: 0 -     Amphetamine-Dextroamphetamine; Take 1 tablet (10 mg total) by mouth daily with breakfast.  Dispense: 30 tablet; Refill: 0 -     Amphetamine-Dextroamphetamine; Take 1 tablet (10 mg total) by mouth daily with breakfast.  Dispense: 30 tablet; Refill: 0 -     DM TEMPLATE  Excissive thrist/fatigue/blurry vision-insulin level variable  could be early DM but sugars have been normal.  Get cgm otc or monitor and check sugars fasting, 2 hrs after meal.  Could be meds, but no changes to meds.  Will see what monitor shows.  Consider  more lab-fasting insulin, c peptide, pro-insulin, insulin antibodies, cortisol, possibly CT abd-attn pancreas to r/o insulinoma.    Return in about 6 months (around 09/18/2023) for video for ADD and 1 yr in person for ADD.   I, Isabelle Course, acting as a scribe for Angelena Sole, MD., have documented all relevant documentation on the behalf of Angelena Sole, MD, as directed by  Angelena Sole, MD while in the presence of Angelena Sole, MD.  I, Angelena Sole, MD, have reviewed all documentation for this visit. The documentation on 03/22/23 for the exam, diagnosis, procedures, and orders are all accurate and complete.    Angelena Sole, MD

## 2023-03-21 LAB — DRUG MONITORING, PANEL 8 WITH CONFIRMATION, URINE
6 Acetylmorphine: NEGATIVE ng/mL (ref <10)
Alcohol Metabolites: NEGATIVE ng/mL (ref <500)
Amphetamines: NEGATIVE ng/mL (ref <500)
Benzodiazepines: NEGATIVE ng/mL (ref <100)
Buprenorphine, Urine: NEGATIVE ng/mL (ref <5)
Cocaine Metabolite: NEGATIVE ng/mL (ref <150)
Creatinine: 42.1 mg/dL (ref >=20.0)
MDMA: NEGATIVE ng/mL (ref <500)
Marijuana Metabolite: NEGATIVE ng/mL (ref <20)
Opiates: NEGATIVE ng/mL (ref <100)
Oxidant: NEGATIVE ug/mL (ref <200)
Oxycodone: NEGATIVE ng/mL (ref <100)
pH: 5.6 (ref 4.5–9.0)

## 2023-03-21 LAB — DM TEMPLATE

## 2023-03-22 DIAGNOSIS — Z833 Family history of diabetes mellitus: Secondary | ICD-10-CM | POA: Insufficient documentation

## 2023-03-22 NOTE — Progress Notes (Signed)
Drug screen as expected Not sure what to make of her labs. Does she have any data yet from the monitor?

## 2023-03-22 NOTE — Assessment & Plan Note (Signed)
Chronic.  Controlloed.  Continue BusPar 5mg  bid prn

## 2023-03-22 NOTE — Assessment & Plan Note (Signed)
Chronic.  Controlled w/adderall 10mg  q am.  Pdmp checked.  3 rx's sent to pharm-message in 3 mo for 3 month supply.  Needs at least a virtual visit in 6 months or can't refill.  Needs in person visit at least yearly and UDS or can't continue to rx

## 2023-03-22 NOTE — Assessment & Plan Note (Signed)
Pt at risk  work on diet/exercise.  Insulin random high.  Free is normal.

## 2023-03-23 ENCOUNTER — Encounter: Payer: Self-pay | Admitting: Family Medicine

## 2023-03-23 DIAGNOSIS — K51011 Ulcerative (chronic) pancolitis with rectal bleeding: Secondary | ICD-10-CM

## 2023-03-23 DIAGNOSIS — E162 Hypoglycemia, unspecified: Secondary | ICD-10-CM

## 2023-03-26 ENCOUNTER — Other Ambulatory Visit: Payer: Self-pay | Admitting: Family Medicine

## 2023-03-26 DIAGNOSIS — E162 Hypoglycemia, unspecified: Secondary | ICD-10-CM

## 2023-03-26 NOTE — Telephone Encounter (Signed)
Please see pt response and advise if anything further is needed.

## 2023-03-30 ENCOUNTER — Other Ambulatory Visit (INDEPENDENT_AMBULATORY_CARE_PROVIDER_SITE_OTHER): Payer: 59

## 2023-03-30 DIAGNOSIS — E162 Hypoglycemia, unspecified: Secondary | ICD-10-CM

## 2023-03-30 LAB — BASIC METABOLIC PANEL
BUN: 8 mg/dL (ref 6–23)
CO2: 25 meq/L (ref 19–32)
Calcium: 8.9 mg/dL (ref 8.4–10.5)
Chloride: 106 meq/L (ref 96–112)
Creatinine, Ser: 0.79 mg/dL (ref 0.40–1.20)
GFR: 95.75 mL/min (ref >60.00)
Glucose, Bld: 86 mg/dL (ref 70–99)
Potassium: 3.8 meq/L (ref 3.5–5.1)
Sodium: 138 meq/L (ref 135–145)

## 2023-03-30 LAB — CORTISOL: Cortisol, Plasma: 13 ug/dL

## 2023-04-01 ENCOUNTER — Encounter: Payer: Self-pay | Admitting: "Endocrinology

## 2023-04-01 ENCOUNTER — Ambulatory Visit (INDEPENDENT_AMBULATORY_CARE_PROVIDER_SITE_OTHER): Payer: 59 | Admitting: "Endocrinology

## 2023-04-01 VITALS — BP 110/70 | HR 86 | Resp 20 | Ht 69.0 in | Wt 188.2 lb

## 2023-04-01 DIAGNOSIS — E162 Hypoglycemia, unspecified: Secondary | ICD-10-CM | POA: Diagnosis not present

## 2023-04-01 LAB — ANTI-ISLET CELL ANTIBODY: Islet Cell Ab: NEGATIVE

## 2023-04-01 MED ORDER — LANCET DEVICE MISC: 1.0000 | Freq: Three times a day (TID) | 0 refills | Status: AC

## 2023-04-01 MED ORDER — BLOOD GLUCOSE MONITORING SUPPL DEVI: 1.0000 | Freq: Three times a day (TID) | 0 refills | Status: DC

## 2023-04-01 MED ORDER — BLOOD GLUCOSE TEST VI STRP: 1.0000 | ORAL_STRIP | Freq: Three times a day (TID) | 3 refills | Status: AC

## 2023-04-01 MED ORDER — LANCETS MISC. MISC: 1.0000 | Freq: Three times a day (TID) | 3 refills | Status: AC

## 2023-04-01 NOTE — Progress Notes (Signed)
Outpatient Endocrinology Note [place], MD    Natalie Barker 07/13/1985 [id number]  Referring Provider: Jeani Sow, MD Primary Care Provider: Jeani Sow, MD Reason for consultation: Subjective   Assessment & Plan  Diagnoses and all orders for this visit:  Hypoglycemia -     Sulfonylurea Hypoglycemics Panel, blood -     Basic metabolic panel -     Insulin, random; Future -     Insulin antibodies, blood; Future -     C-peptide -     Beta-hydroxybutyric acid -     Proinsulin -     Cortisol -     Glucagon -     Insulin-like growth factor; Future -     Comprehensive metabolic panel; Future  Other orders -     Blood Glucose Monitoring Suppl DEVI; 1 each by Does not apply route in the morning, at noon, and at bedtime. May substitute to any manufacturer covered by patient's insurance. -     Glucose Blood (BLOOD GLUCOSE TEST STRIPS) STRP; 1 each by In Vitro route in the morning, at noon, and at bedtime. May substitute to any manufacturer covered by patient's insurance. -     Lancet Device MISC; 1 each by Does not apply route in the morning, at noon, and at bedtime. May substitute to any manufacturer covered by patient's insurance. -     Lancets Misc. MISC; 1 each by Does not apply route in the morning, at noon, and at bedtime. May substitute to any manufacturer covered by patient's insurance.   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 is very active on her feet, usually drops at 3:30 PM when her shift ends but has also experienced these episodes fasting/after meals across the day including overnight episodes Patient has history of ulcerative colitis on Ustekinumab since 2022 Blood sugars have gone as low as 40 on the libre 3, symptomatic, with improvement in  symptoms after 15 minutes with almost complete resolution and 30 minutes, meetings [facility] [facility] Patient has been symptomatic when the blood sugar drops less than 70, including dizziness 02/27/2023 8 AM labs showed normal insulin, cortisol, kidney function: CMP was done in 2023 Ordered baseline labs to be drawn when the blood sugar is less than 50, only to be done if it can be done safely at home otherwise encourage patient to go to ER for the full workup Discussed dietary changes at length, given written dietary instructions Ordered blood glucose meter and supplies  Patient will call to make follow-up as soon as needed based on BG No follow-ups on file.   I have reviewed current medications, nurse's notes, allergies, vital signs, past medical and surgical history, family medical history, and social history for this encounter. Counseled patient on symptoms, examination findings, lab findings, imaging results, treatment decisions and monitoring and prognosis. The patient understood the recommendations and agrees with the treatment plan. All questions regarding treatment plan were fully answered.  Natalie Aguilar, MD  04/01/23   History of Present Illness HPI  LAKYIA BEHE is a 37 y.o. year old female who presents for evaluation of hypoglycemia.  Reports most of the lows happen between 3-4 pm No change in activity/diet/medications   The timing of onset of symptoms relative to the time of meal ingestion is fastin as well as after the meals.  Feels symptoms when BG <70  + fatigue, dizzy, blurry vision, nausea and vomiting Gets up at 6am, breast fast around 7 am, 10:30 am in lunch, 1 pm small snack, dinner 5:30  pm, may have snack before bed at 9 pm  -The patient's medication and drug history was reviewed carefully for potential causes of hypoglycemia. -No history of insulin usage or ingestion of an oral hypoglycemic agent -No history of diabetes mellitus, -No history of renal  insufficiency/failure, -No history of alcoholism -No history of  hepatic cirrhosis/failure, -No history of other endocrine diseases -History of total gastrectomy + for stomach cancer -History of cardiac arrythmia  -No history of endocrine disorders (eg, pheochromocytoma, Addison disease, glucagon deficiency, carcinomas, extrahepatic tumors) -No history of substance abuse (eg, cocaine, ethanol, salicylates, beta-blockers, pentamidine) -No history of nutritional disorders (eg, prolonged starvation before anesthesia, protein calorie malnutrition, low-calorie ketogenic diet, renal disease)   Physical Exam  BP 110/70 (BP Location: Left Arm, Patient Position: Sitting, Cuff Size: Normal)   Pulse 86   Resp 20   Ht 5\' 9"  (1.753 m)   Wt 188 lb 3.2 oz (85.4 kg)   SpO2 97%   BMI 27.79 kg/m    Constitutional: well developed, well nourished Head: normocephalic, atraumatic Eyes: sclera anicteric, no redness Neck: supple Lungs: normal respiratory effort Neurology: alert and oriented Skin: dry, no appreciable rashes Musculoskeletal: no appreciable defects Psychiatric: normal mood and affect   Current Medications Patient's Medications  New Prescriptions   BLOOD GLUCOSE MONITORING SUPPL DEVI    1 each by Does not apply route in the morning, at noon, and at bedtime. May substitute to any manufacturer covered by patient's insurance.   GLUCOSE BLOOD (BLOOD GLUCOSE TEST STRIPS) STRP    1 each by In Vitro route in the morning, at noon, and at bedtime. May substitute to any manufacturer covered by patient's insurance.   LANCET DEVICE MISC    1 each by Does not apply route in the morning, at noon, and at bedtime. May substitute to any manufacturer covered by patient's insurance.   LANCETS MISC. MISC    1 each by Does not apply route in the morning, at noon, and at bedtime. May substitute to any manufacturer covered by patient's insurance.  Previous Medications   ALBUTEROL (PROVENTIL HFA;VENTOLIN HFA)  108 (90 BASE) MCG/ACT INHALER    Inhale 2 puffs into the lungs every 4 (four) hours as needed for wheezing or shortness of breath.    AMPHETAMINE-DEXTROAMPHETAMINE (ADDERALL) 10 MG TABLET    Take 1 tablet (10 mg total) by mouth daily with breakfast.   AMPHETAMINE-DEXTROAMPHETAMINE (ADDERALL) 10 MG TABLET    Take 1 tablet (10 mg total) by mouth daily with breakfast.   AMPHETAMINE-DEXTROAMPHETAMINE (ADDERALL) 10 MG TABLET    Take 1 tablet (10 mg total) by mouth daily with breakfast.   AMPHETAMINE-DEXTROAMPHETAMINE (ADDERALL) 10 MG TABLET    Take 1 tablet (10 mg total) by mouth daily with breakfast.   BUSPIRONE (BUSPAR) 5 MG TABLET    Take 1 tablet (5 mg total) by mouth 2 (two) times daily.   CONTINUOUS GLUCOSE SENSOR (FREESTYLE LIBRE 3 PLUS SENSOR) MISC    Change sensor every 15 days.   CYANOCOBALAMIN (VITAMIN B12) 1000 MCG/ML INJECTION    Inject 1 mL (1,000 mcg total) into the muscle every 30 (thirty) days.   EPINEPHRINE 0.3 MG/0.3 ML IJ SOAJ INJECTION    Inject 0.3 mg into the muscle once as needed (for severe allergic reaction).    FERROUS  SULFATE (IRON PO)    Take by mouth.   LORATADINE (CLARITIN) 10 MG TABLET    Take 10 mg by mouth daily as needed for allergies.   MULTIPLE VITAMIN (MULTI VITAMIN DAILY PO)    Take by mouth.   SYRINGE-NEEDLE, DISP, 3 ML (LUER LOCK SAFETY SYRINGES) 25G X 1" 3 ML MISC    1 each by Does not apply route once a week.   USTEKINUMAB (STELARA) 90 MG/ML SOSY INJECTION    Inject into the skin.   VITAMIN D PO    Take by mouth.  Modified Medications   No medications on file  Discontinued Medications   No medications on file    Allergies Allergies  Allergen Reactions   Ciprofloxacin     Other reaction(s): Dizziness, severe dizziness   Iodine Anaphylaxis    Other reaction(s): anaphylaxis   Oseltamivir     Other reaction(s): Other (See Comments) Bloody diarrhea Other reaction(s): bloody diarrhea   Penicillins Hives, Shortness Of Breath, Other (See Comments) and  Anaphylaxis    Has patient had a PCN reaction causing immediate rash, facial/tongue/throat swelling, SOB or lightheadedness with hypotension: Yes Has patient had a PCN reaction causing severe rash involving mucus membranes or skin necrosis: No Has patient had a PCN reaction that required hospitalization No Has patient had a PCN reaction occurring within the last 10 years: YES If all of the above answers are "NO", then may proceed with Cephalosporin use.  Has patient had a PCN reaction causing immediate rash, facial/tongue/throat swelling, SOB or lightheadedness with hypotension: Yes  Other reaction(s): cardiac arrest/anaphlaxis   Shellfish Allergy Anaphylaxis   Shellfish-Derived Products Anaphylaxis    Other reaction(s): cardiac arrest/anaphlaxis Cardiac Arrest   Adalimumab     Other reaction(s): caused cirrhosis, Dizziness   Bactroban [Mupirocin Calcium] Hives   Egg-Derived Products     Other reaction(s): vomiting   Metronidazole     Other reaction(s): Dizziness, severe dizziness   Other     Other reaction(s): Vomiting Eggs   Mupirocin Hives and Rash    Other reaction(s): unknown    Past Medical History Past Medical History:  Diagnosis Date   Allergy    Anemia    Anxiety    Asthma    Psoriasis    Pyelonephritis    in grade school   Ulcer February 2021   Ulcerative colitis    Past Surgical History Past Surgical History:  Procedure Laterality Date   CESAREAN SECTION N/A 02/16/2013   Procedure: CESAREAN SECTION;  Surgeon: Geryl Rankins, MD;  Location: [facility];  Service: Obstetrics;  Laterality: N/A;   CESAREAN SECTION N/A 09/23/2016   Procedure: REPEAT CESAREAN SECTION;  Surgeon: Geryl Rankins, MD;  Location: [facility] BIRTH[facility] SUIT[facility];  Service: Obstetrics;  Laterality: N/A;  EDD 09/30/2016   EYE SURGERY     contacts surgically implanted    Family History family history includes ALS in her maternal uncle; Alcohol abuse in her paternal grandfather; Asthma in her daughter and  mother; Birth defects in her brother; COPD in her maternal grandmother; Cancer in her maternal aunt, maternal grandfather, maternal grandmother, and mother; Diabetes in her maternal grandmother; Heart disease in her father and maternal grandmother; Hyperlipidemia in her paternal grandfather; Hypertension in her father and maternal grandmother; Stroke in her maternal grandmother, paternal grandmother, and paternal uncle.  Social History Social History   Socioeconomic History   Marital status: Married    Spouse name: Not on file   Number of children: 2   Years  of education: Not on file   Highest education level: Not on file  Occupational History   Not on file  Tobacco Use   Smoking status: Never   Smokeless tobacco: Never   Tobacco comments:    Never used it  Vaping Use   Vaping status: Never Used  Substance and Sexual Activity   Alcohol use: Not Currently   Drug use: Never   Sexual activity: Yes    Birth control/protection: Condom  Other Topics Concern   Not on file  Social History Narrative   Ambulance person   Social Determinants of Health   Financial Resource Strain: Not on file  Food Insecurity: Not on file  Transportation Needs: Not on file  Physical Activity: Not on file  Stress: Not on file  Social Connections: Not on file  Intimate Partner Violence: Not on file    No results found for: "CHOL" No results found for: "HDL" No results found for: "LDLCALC" No results found for: "TRIG" No results found for: "CHOLHDL" Lab Results  Component Value Date   CREATININE 0.79 03/30/2023   Lab Results  Component Value Date   GFR 95.75 03/30/2023      Component Value Date/Time   NA 138 03/30/2023 0811   K 3.8 03/30/2023 0811   CL 106 03/30/2023 0811   CO2 25 03/30/2023 0811   GLUCOSE 86 03/30/2023 0811   BUN 8 03/30/2023 0811   CREATININE 0.79 03/30/2023 0811   CREATININE 0.69 02/27/2023 1616   CALCIUM 8.9 03/30/2023 0811   PROT 6.3 (L) 06/10/2016 1250    ALBUMIN 3.2 (L) 06/10/2016 1250   AST 19 06/10/2016 1250   ALT 7 (L) 06/10/2016 1250   ALKPHOS 77 06/10/2016 1250   BILITOT 0.5 06/10/2016 1250   GFRNONAA >60 06/10/2016 1250   GFRAA >60 06/10/2016 1250      Latest Ref Rng & Units 03/30/2023    8:11 AM 03/10/2023    8:23 AM 02/27/2023    4:16 PM  BMP  Glucose 70 - 99 mg/dL 86  89  85   BUN 6 - 23 mg/dL 8   12   Creatinine 4.09 - 1.20 mg/dL 8.11   9.14   BUN/Creat Ratio 6 - 22 (calc)   SEE NOTE:   Sodium 135 - 145 mEq/L 138   138   Potassium 3.5 - 5.1 mEq/L 3.8   3.7   Chloride 96 - 112 mEq/L 106   105   CO2 19 - 32 mEq/L 25   25   Calcium 8.4 - 10.5 mg/dL 8.9   9.0        Component Value Date/Time   WBC 8.2 02/27/2023 1616   RBC 4.30 02/27/2023 1616   HGB 12.0 02/27/2023 1616   HCT 37.6 02/27/2023 1616   PLT 320 02/27/2023 1616   MCV 87.4 02/27/2023 1616   MCH 27.9 02/27/2023 1616   MCHC 31.9 (L) 02/27/2023 1616   RDW 13.0 02/27/2023 1616   LYMPHSABS 2,001 02/27/2023 1616   MONOABS 0.5 09/13/2016 0930   EOSABS 262 02/27/2023 1616   BASOSABS 57 02/27/2023 1616   Lab Results  Component Value Date   TSH 1.31 02/27/2023         Parts of this note may have been dictated using voice recognition software. There may be variances in spelling and vocabulary which are unintentional. Not all errors are proofread. Please notify the Thereasa Parkin if any discrepancies are noted or if the meaning of any statement is not clear.

## 2023-04-01 NOTE — Patient Instructions (Signed)
10-Point Nutrition Plan for Preventing Hypoglycemia Control portions of carbohydrate - 30 grams/meal, 15 grams/snack. Choose low-glycemic carbohydrates. Avoid high-glycemic carbohydrates. Include (heart-healthy) fats in each meal or snack - 15 grams/meal, 5 grams/snack. Emphasize optimal protein intake. Space meals/snacks 3-4 hours apart. Avoid consuming liquids with meals. Avoid alcohol. Avoid caffeine. Maintain post-bariatric vitamin and mineral intake.   Low Glycemic Index Carbohydrates (CHOOSE) Steel-cut oats (regular, not quick-cook or instant) Oat bran cereal Beans/legumes (e.g., garbanzo, navy, kidney, lima, pinto, black-eyed and pea beans, edamame (soybeans), lentils Bean products (e.g., hummus, tofu) Pearled barley, cooked al dente Yams Some fruits (e.g., grapefruit, apples, pears, berries, apricots, peaches) Some pasta (e.g., Barilla Plus pasta), cooked al dente Some whole grain breads (e.g., Ezekiel bread, Joseph's Flax, Oat Bran & Whole Wheat Pita/Lavash/Tortillas) Some whole grain crackers (e.g., RyKrisp, RyVita, Wasa) Brown rice, wild rice Quinoa  Buckwheat (a grass)   High Glycemic Index Carbohydrates (AVOID) Refined breakfast cereals (e.g., Corn Flakes, Rice Krispies, Cream of Rice, instant oatmeal) Regular pasta Most starchy vegetables (e.g., white potatoes, corn, winter (orange) squash) White rice, rice cakes Popcorn, pretzels, chips Some fruits (e.g., ripe bananas, pineapple, mango, watermelon, grapes) All fruit juices and sweetened drinks (e.g., sodas, sweetened iced tea) Bread, rolls, bagels, English muffins, and crackers made with refined flour Sweets (e.g., candy, cake, cookies, ice cream, syrup)   Heart-Healthy Fats (Adapt) Nuts, nut butters Avocado, guacamole Olives Most plant oils (e.g., olive, canola, peanut, soy, sunflower, sesame) Most seeds (e.g., sunflower, flax, sesame/sesame tahini) Oily fish (e.g., salmon, bluefish, mackerel, tuna,  sardines)

## 2023-04-02 ENCOUNTER — Other Ambulatory Visit: Payer: Self-pay

## 2023-04-02 DIAGNOSIS — E11649 Type 2 diabetes mellitus with hypoglycemia without coma: Secondary | ICD-10-CM

## 2023-04-02 MED ORDER — DEXCOM G7 SENSOR MISC: 1.0000 | 0 refills | Status: DC

## 2023-04-02 NOTE — Progress Notes (Signed)
So far labs ok, but plan per endo.

## 2023-04-06 ENCOUNTER — Telehealth: Payer: Self-pay | Admitting: Internal Medicine

## 2023-04-06 NOTE — Telephone Encounter (Signed)
Hi Dr. Leonides Schanz,   Supervising Provider PM 04/06/23   We received a referral for patient for ulcerative colitis. Patient last seen with [facility] in July of 2023. Patient is requesting to transfer her care due to her insurance no longer being in network with [facility]. Patient states she has also moved and is closer to [place]. Patient's previous records are in [facility] for you to review and advise on scheduling.    Thank you

## 2023-04-06 NOTE — Telephone Encounter (Signed)
Okay to schedule for OV. 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

## 2023-04-09 ENCOUNTER — Telehealth: Payer: Self-pay | Admitting: Gastroenterology

## 2023-04-09 NOTE — Telephone Encounter (Signed)
Hi Natalie Barker,    Outbound call to patient to schedule new patient appointment due to transfer of care. Patient has been scheduled for 11/21. 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 Please advise on request.    Thank you.

## 2023-04-09 NOTE — Telephone Encounter (Signed)
Left message for patient to call office.  

## 2023-04-10 LAB — PROINSULIN/INSULIN RATIO
Insulin: 6.4 u[IU]/mL
Proinsulin: 1.9 pmol/L

## 2023-04-10 NOTE — Telephone Encounter (Signed)
Switched appointment to a Mychart visit. Patient just need refills on Stelara.

## 2023-04-14 ENCOUNTER — Other Ambulatory Visit (HOSPITAL_COMMUNITY): Payer: Self-pay

## 2023-04-14 ENCOUNTER — Telehealth: Payer: Self-pay

## 2023-04-14 NOTE — Telephone Encounter (Signed)
Pharmacy Patient Advocate Encounter   Received notification from CoverMyMeds that prior authorization for Dexcom G7 sensor is required/requested.   Insurance verification completed.   The patient is insured through [facility] .   Per test claim: PA required; PA submitted to above mentioned insurance via CoverMyMeds Key/confirmation #/EOC [id number] Status is pending

## 2023-04-15 LAB — INSULIN ANTIBODIES, BLOOD: Insulin Antibodies, Human: <0.4 U/mL (ref <0.4)

## 2023-04-15 LAB — BETA-HYDROXYBUTYRATE: Beta-Hydroxybutyric Acid: 0.07 mmol/L

## 2023-04-15 LAB — INSULIN, RANDOM: Insulin: 8.9 u[IU]/mL

## 2023-04-15 LAB — PROINSULIN: Proinsulin: 4.7 pmol/L (ref <=18.8)

## 2023-04-15 LAB — C-PEPTIDE: C-Peptide: 1.91 ng/mL (ref 0.80–3.85)

## 2023-04-16 ENCOUNTER — Encounter: Payer: Self-pay | Admitting: Gastroenterology

## 2023-04-16 ENCOUNTER — Telehealth (INDEPENDENT_AMBULATORY_CARE_PROVIDER_SITE_OTHER): Payer: 59 | Admitting: Gastroenterology

## 2023-04-16 ENCOUNTER — Telehealth: Payer: Self-pay | Admitting: Gastroenterology

## 2023-04-16 DIAGNOSIS — K5150 Left sided colitis without complications: Secondary | ICD-10-CM | POA: Diagnosis not present

## 2023-04-16 NOTE — Patient Instructions (Addendum)
Our office will contact you to schedule for colonoscopy sometime in the spring.  Will fax a lab order to [facility] for you to have these drawn when you go for labs for your PCP or endocrinologist.  Will renew your prescription for Stelara.

## 2023-04-16 NOTE — Progress Notes (Signed)
I agree with the assessment and plan as outlined by Ms. Zehr. 

## 2023-04-16 NOTE — Telephone Encounter (Signed)
Ms. aleiya, kistner are scheduled for a virtual visit with your provider today.    Just as we do with appointments in the office, we must obtain your consent to participate.  Your consent will be active for this visit and any virtual visit you may have with one of our providers in the next 365 days.    If you have a MyChart account, I can also send a copy of this consent to you electronically.  All virtual visits are billed to your insurance company just like a traditional visit in the office.  As this is a virtual visit, video technology does not allow for your provider to perform a traditional examination.  This may limit your provider's ability to fully assess your condition.  If your provider identifies any concerns that need to be evaluated in person or the need to arrange testing such as labs, EKG, etc, we will make arrangements to do so.    Although advances in technology are sophisticated, we cannot ensure that it will always work on either your end or our end.  If the connection with a video visit is poor, we may have to switch to a telephone visit.  With either a video or telephone visit, we are not always able to ensure that we have a secure connection.   I need to obtain your verbal consent now.   Are you willing to proceed with your visit today?   NYKESHA SWALES has provided verbal consent on 04/16/2023 for a virtual visit (video or telephone).   Princella Pellegrini. Pressley Tadesse, [name] 04/16/2023  12:58 PM

## 2023-04-16 NOTE — Progress Notes (Addendum)
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  She is wanting to establish care.  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  This was a virtual video visit.  Provider was located at work in her office and patient was located at work on the school playground.  Last GI care was through [facility], but her insurance no longer participates with that facility.   PERTINENT IBD HISTORY: - November 2020: rectal bleeding with mucus and diarrhea after a COVID infection Received prednisone through her COVID infection due to her history of asthma, which is well controlled. Her PCP also empirically treated with 7 days of cipro/flagyl, with no effect. - Colonoscopy 09/21/2019: inflammed and ulcerated mucosa in the rectum. Biopsies showed mildly active chronic active proctitis, inactive chronic colitis in the sigmoid and descending (endoscopically normal). The TI and rest of colon were histologically normal.  - 09/2019: started mesalamine 2.4g daily with partial improvement. - 11/23/19: Canasa suppositories were added. Due to insurance/cost reasons she transitioned to mesalamine 800mg  BID. This did not fully improve her symptoms. - 08/15/20 CTAP was normal. - 08/22/2020 started anusol BID.  - Flex sig 08/31/2020: severely altered vascular pattern, congested, erythematous, friable mucosa in the rectum. Biopsies showed mildly active chronic colitis without dysplasia or granuloma.Transitioned to humira with induction followed by 40mg  every 14 days maintenance dosing + prednisone taper. - 10/2020: developed pus filled bumps and flaking rash from her head to toes. This was biopsied by a dermatology (she follows with Linton Rump, PA, in [place]), and reportedly showed psoriasis. This was felt to be related to  her adalimumab, which she stopped in December 2022.  - Flex sig 12/2020 on Humira: reportedly still showed mildly active chronic colitis, despite clinical remission - 04/2021: Adalimumab stopped due to psoriasis. Rash did not improve despite stopping adalimumab, not fully responsive to topical steroids.  - 07/2021: established care in [facility].  Started Stelara.    Colon cancer in mother (diagnosed at age 23), maternal grandfather with many polyps and then colon cancer. Mother also had thyroid cancer.  No NSAIDs. Denies smoking. 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   She says that she has been doing well.  Has normal bowel movements, sometimes only 2 or 3 times a week.  No rectal bleeding.  No abdominal pain.  She says that she has been having issues with hypoglycemia so is working with endocrinology to try to figure that out.  Is wondering if it has any association with her ulcerative colitis.  At her last visit with GI at [facility] they had suggested repeating colonoscopy for staging and to confirm endoscopic and histologic healing on Stelara.  Recent basic labs look good.  Past Medical History:  Diagnosis Date   Allergy    Anemia    Anxiety    Asthma    Psoriasis    Pyelonephritis    in grade school   Ulcer February 2021   Ulcerative colitis   Past Surgical History:  Procedure Laterality Date   CESAREAN SECTION N/A 02/16/2013   Procedure: CESAREAN SECTION;  Surgeon: Geryl Rankins, MD;  Location: [facility];  Service: Obstetrics;  Laterality: N/A;   CESAREAN SECTION N/A 09/23/2016   Procedure: REPEAT CESAREAN SECTION;  Surgeon: Geryl Rankins, MD;  Location:  [facility];  Service: Obstetrics;  Laterality: N/A;  EDD 09/30/2016   EYE SURGERY     contacts surgically implanted    reports that she has never smoked. She has never used smokeless tobacco. She reports that she does not currently use alcohol. She reports that she does not use drugs. family history includes ALS  in her maternal uncle; Alcohol abuse in her paternal grandfather; Asthma in her daughter and mother; Birth defects in her brother; COPD in her maternal grandmother; Cancer in her maternal aunt, maternal grandfather, maternal grandmother, and mother; Diabetes in her maternal grandmother; Heart disease in her father and maternal grandmother; Hyperlipidemia in her paternal grandfather; Hypertension in her father and maternal grandmother; Stroke in her maternal grandmother, paternal grandmother, and paternal uncle. Allergies  Allergen Reactions   Ciprofloxacin     Other reaction(s): Dizziness, severe dizziness   Iodine Anaphylaxis    Other reaction(s): anaphylaxis   Oseltamivir     Other reaction(s): Other (See Comments) Bloody diarrhea Other reaction(s): bloody diarrhea   Penicillins Hives, Shortness Of Breath, Other (See Comments) and Anaphylaxis    Has patient had a PCN reaction causing immediate rash, facial/tongue/throat swelling, SOB or lightheadedness with hypotension: Yes Has patient had a PCN reaction causing severe rash involving mucus membranes or skin necrosis: No Has patient had a PCN reaction that required hospitalization No Has patient had a PCN reaction occurring within the last 10 years: YES If all of the above answers are "NO", then may proceed with Cephalosporin use.  Has patient had a PCN reaction causing immediate rash, facial/tongue/throat swelling, SOB or lightheadedness with hypotension: Yes  Other reaction(s): cardiac arrest/anaphlaxis   Shellfish Allergy Anaphylaxis   Shellfish-Derived Products Anaphylaxis    Other reaction(s): cardiac arrest/anaphlaxis Cardiac Arrest   Adalimumab     Other reaction(s): caused cirrhosis, Dizziness   Bactroban [Mupirocin Calcium] Hives   Egg-Derived Products     Other reaction(s): vomiting   Metronidazole     Other reaction(s): Dizziness, severe dizziness   Other     Other reaction(s): Vomiting Eggs   Mupirocin Hives and Rash     Other reaction(s): unknown      Outpatient Encounter Medications as of 04/16/2023  Medication Sig   albuterol (PROVENTIL HFA;VENTOLIN HFA) 108 (90 BASE) MCG/ACT inhaler Inhale 2 puffs into the lungs every 4 (four) hours as needed for wheezing or shortness of breath.    amphetamine-dextroamphetamine (ADDERALL) 10 MG tablet Take 1 tablet (10 mg total) by mouth daily with breakfast.   amphetamine-dextroamphetamine (ADDERALL) 10 MG tablet Take 1 tablet (10 mg total) by mouth daily with breakfast.   [START ON 04/19/2023] amphetamine-dextroamphetamine (ADDERALL) 10 MG tablet Take 1 tablet (10 mg total) by mouth daily with breakfast.   [START ON 05/19/2023] amphetamine-dextroamphetamine (ADDERALL) 10 MG tablet Take 1 tablet (10 mg total) by mouth daily with breakfast.   Blood Glucose Monitoring Suppl DEVI 1 each by Does not apply route in the morning, at noon, and at bedtime. May substitute to any manufacturer covered by patient's insurance.   busPIRone (BUSPAR) 5 MG tablet Take 1 tablet (5 mg total) by mouth 2 (two) times daily.   Continuous Glucose Sensor (DEXCOM G7 SENSOR) MISC 1 Device by Does not apply route continuous.   Continuous Glucose Sensor (FREESTYLE LIBRE 3 PLUS SENSOR) MISC Change sensor every 15 days.   EPINEPHrine 0.3 mg/0.3 mL IJ SOAJ injection Inject 0.3 mg into the muscle once as needed (for severe allergic reaction).    Ferrous Sulfate (  IRON PO) Take by mouth.   Glucose Blood (BLOOD GLUCOSE TEST STRIPS) STRP 1 each by In Vitro route in the morning, at noon, and at bedtime. May substitute to any manufacturer covered by patient's insurance.   Lancet Device MISC 1 each by Does not apply route in the morning, at noon, and at bedtime. May substitute to any manufacturer covered by patient's insurance.   Lancets Misc. MISC 1 each by Does not apply route in the morning, at noon, and at bedtime. May substitute to any manufacturer covered by patient's insurance.   loratadine (CLARITIN) 10  MG tablet Take 10 mg by mouth daily as needed for allergies.   Multiple Vitamin (MULTI VITAMIN DAILY PO) Take by mouth.   SYRINGE-NEEDLE, DISP, 3 ML (LUER LOCK SAFETY SYRINGES) 25G X 1" 3 ML MISC 1 each by Does not apply route once a week.   ustekinumab (STELARA) 90 MG/ML SOSY injection Inject into the skin.   VITAMIN D PO Take by mouth.   [DISCONTINUED] cyanocobalamin (VITAMIN B12) 1000 MCG/ML injection Inject 1 mL (1,000 mcg total) into the muscle every 30 (thirty) days.   No facility-administered encounter medications on file as of 04/16/2023.    REVIEW OF SYSTEMS  : All other systems reviewed and negative except where noted in the History of Present Illness.   PHYSICAL EXAM: There were no vitals taken for this visit. General: Well developed white female in no acute distress  Remaining exam not performed since it was a virtual/video visit.  ASSESSMENT AND PLAN: Ms. Amelio is a 37 y.o. 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 Clinical remission on stelara, and significant improvement of psoriasis.  1. Left sided ulcerative colitis with rectal bleeding c/b adalimumab related psoriasis, currently on ustekinumab since 07/2021. See discussion above. -Will check a CRP, sed rate, and QuantiFERON gold through [facility] when she goes to have labs drawn for her PCP or endocrinologist. 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 - Continue ustekinumab 90mg  every 8 weeks. -She used to see a dermatologist regularly, but since is her psoriasis is under good control she has not seen them in a while.  Suggested yearly dermatology screening.  2. History of IDA. Iron supplementation PRN. CBC shows anemia is resolved.  3. Psoriasis. Improved on stelara.  4. History of B12 deficiency: Was previously on monthly injections, but recent level was  in the 300s.  She was told she no longer needed injections.  May want to consider oral iron supplement daily or at least on a weekly basis.  5. Elevated alk phos. Now normal, as are AMA/ASMA that were checked at [facility].  6. History of vitamin D deficiency:  on supplements/   CC:  Jeani Sow, MD

## 2023-04-16 NOTE — Addendum Note (Signed)
Addended by: Doug Sou D on: 04/16/2023 04:53 PM   Modules accepted: Orders

## 2023-04-20 ENCOUNTER — Telehealth: Payer: Self-pay

## 2023-04-20 NOTE — Telephone Encounter (Signed)
-----   Message from [place] D. Zehr sent at 04/16/2023  1:44 PM EST ----- Patient had a visit with me today to establish care.  Needs her Stelara refilled at 90 mg every 8 weeks for her ulcerative colitis.  Could she please initiate that prescription?  Thank you,  Shanda Bumps

## 2023-04-20 NOTE — Telephone Encounter (Signed)
Message relayed to the pt via My Chart- she does view messages.

## 2023-04-20 NOTE — Telephone Encounter (Signed)
Zehr, Princella Pellegrini, [name]  Loretha Stapler, RN Please let the patient know that this was Dr. Derek Mound response to the question about the hypoglycemia.  Not really sure there is anything else that we do about it from a gastro standpoint.  Please relay Dr. Derek Mound response below.       Previous Messages    ----- Message ----- From: Imogene Burn, MD Sent: 04/16/2023   4:53 PM EST To: Princella Pellegrini Zehr, [name]  Because UC is an autoimmune condition, it can rarely be associated with insulin autoimmune syndrome. Her endocrinologist should be able to evaluate for this. I suppose there could theoretically be issues with absorption of glucose with UC as well. She may need to consume food/liquids pre-emptively to prevent hypoglycemia. ----- Message ----- From: Leta Baptist, [name] Sent: 04/16/2023   2:04 PM EST To: Imogene Burn, MD  I saw this patient as a virtual visit this afternoon.  I will be finishing my note here shortly, but she is doing well from a UC standpoint on her Stelara.  She is having some issues with hypoglycemia apparently though and has been seeing endocrinology.  She was asking about association with this and ulcerative colitis.  I said that I would have to get your input on this.  Let me know your thoughts.  Thank you,  Jess

## 2023-04-20 NOTE — Telephone Encounter (Signed)
Pharmacy Patient Advocate Encounter  Received notification from [facility] that Prior Authorization for Dexcom G7 sensor has been DENIED.  See denial reason below. No denial letter attached in CMM. Will attach denial letter to Media tab once received.     PA #/Case ID/Reference #: [id number]

## 2023-04-30 ENCOUNTER — Other Ambulatory Visit: Payer: Self-pay

## 2023-05-04 ENCOUNTER — Other Ambulatory Visit (HOSPITAL_COMMUNITY): Payer: Self-pay

## 2023-05-20 ENCOUNTER — Telehealth: Payer: 59 | Admitting: Physician Assistant

## 2023-05-20 DIAGNOSIS — J020 Streptococcal pharyngitis: Secondary | ICD-10-CM

## 2023-05-20 MED ORDER — AZITHROMYCIN 250 MG PO TABS: ORAL_TABLET | ORAL | 0 refills | Status: AC

## 2023-05-20 NOTE — Progress Notes (Signed)

## 2023-06-01 ENCOUNTER — Encounter: Payer: Self-pay | Admitting: "Endocrinology

## 2023-06-03 ENCOUNTER — Other Ambulatory Visit (HOSPITAL_COMMUNITY): Payer: Self-pay

## 2023-06-11 ENCOUNTER — Telehealth (INDEPENDENT_AMBULATORY_CARE_PROVIDER_SITE_OTHER): Payer: 59 | Admitting: "Endocrinology

## 2023-06-11 ENCOUNTER — Encounter: Payer: Self-pay | Admitting: "Endocrinology

## 2023-06-11 DIAGNOSIS — E162 Hypoglycemia, unspecified: Secondary | ICD-10-CM | POA: Diagnosis not present

## 2023-06-11 NOTE — Progress Notes (Addendum)
The patient reports they are currently: . It was a video visit with the patient on the date of service. I spent an additional 5-10 minutes on pre- and post-visit activities on the date of service.   The patient was physically located in [place] or a state in which I am permitted to provide care. The patient and/or parent/guardian understood that s/he may incur co-pays and cost sharing, and agreed to the telemedicine visit. The visit was reasonable and appropriate under the circumstances given the patient's presentation at the time.  The patient and/or parent/guardian has been advised of the potential risks and limitations of this mode of treatment (including, but not limited to, the absence of in-person examination) and has agreed to be treated using telemedicine. The patient's/patient's family's questions regarding telemedicine have been answered.   The patient and/or parent/guardian has also been advised to contact their provider's office for worsening conditions, and seek emergency medical treatment and/or call 911 if the patient deems either necessary.     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 is very active on her feet, usually drops at 3:30 PM when her shift ends but has also experienced these episodes fasting/after meals across the day including overnight episodes Patient has history of ulcerative colitis on Ustekinumab since 2022 Blood sugars have gone as low as 40 on the [address] [address],  symptomatic, with improvement in symptoms after 15 minutes with almost complete resolution and 30 minutes, meetings Whipple's triad Patient has been symptomatic when the blood sugar drops less than 70, including dizziness previously Lowest BG this visit reported was 70, but patient continues to have some episodes of "feeling low" 02/27/2023 8 AM labs showed normal insulin, cortisol, kidney function: CMP was done in 2023 03/30/23 Baseline labs drawn reported BG to be 86, with normal cortisol, insulin, pro-insulin and c-peptide (ideally need to be done in ER when BG meets whipple's criteria) Discussed dietary changes at length, given written dietary instructions Ordered blood glucose meter and supplies  Patient will call to make follow-up as soon as needed based on BG No follow-ups on file.   I have reviewed current medications, nurse's notes, allergies, vital signs, past medical and surgical history, family medical history, and social history for this encounter. Counseled patient on symptoms, examination findings, lab findings, imaging results, treatment decisions and monitoring and prognosis. The patient understood the recommendations and agrees with the treatment plan. All questions regarding treatment plan were fully answered.  Altamese Rochelle, MD  06/11/23   History of Present Illness HPI  Natalie Barker is a 38 y.o. year old female who presents for evaluation of hypoglycemia.\   CGM interpretation: At today's visit, we reviewed her CGM downloads. The full report is scanned in the media. Reviewing the CGM trends, BG are reported 100% in range between Dec 26-Jan 8.  Lowest BG this visit reported was 70, but patient continues to have some episodes of "feeling low" Dexcom alarm has waken up patient to report low at which point patient proceeded with food to prevent low and was unable to  sleep after   Component     Latest Ref Rng 03/30/2023 8:11am  Sodium     135 - 145 mEq/L 138    Potassium     3.5 - 5.1 mEq/L 3.8   Chloride     96 - 112 mEq/L 106   CO2     19 - 32 mEq/L 25   Glucose     70 - 99 mg/dL 86   BUN     6 - 23 mg/dL 8   Creatinine     9.62 - 1.20 mg/dL 9.52   GFR     >84.13 mL/min 95.75   Calcium     8.4 - 10.5 mg/dL 8.9   Proinsulin     pmol/L 1.9   Proinsulin     < OR = 18.8 pmol/L 4.7   INSULIN     uIU/mL 8.9   INSULIN     uIU/mL 6.4   Islet Cell Ab     Neg:<1:1  Negative   C-Peptide     0.80 - 3.85 ng/mL 1.91   Insulin Antibodies, Human     <0.4 U/mL <0.4   Cortisol, Plasma     ug/dL 24.4   Beta-Hydroxybutyric Acid     mmol/L 0.07      Initially:  Reports most of the lows happen between 3-4 pm No change in activity/diet/medications   The timing of onset of symptoms relative to the time of meal ingestion is fastin as well as after the meals. Feels symptoms when BG <70  + fatigue, dizzy, blurry vision, nausea and vomiting Gets up at 6am, breast fast around 7 am, 10:30 am in lunch, 1 pm small snack, dinner 5:30  pm, may have snack before bed at 9 pm  -The patient's medication and drug history was reviewed carefully for potential causes of hypoglycemia. -No history of insulin usage or ingestion of an oral hypoglycemic agent -No history of diabetes mellitus, -No history of renal insufficiency/failure, -No history of alcoholism -No history of  hepatic cirrhosis/failure, -No history of other endocrine diseases -History of total gastrectomy + for stomach cancer -History of cardiac arrythmia  -No history of endocrine disorders (eg, pheochromocytoma, Addison disease, glucagon deficiency, carcinomas, extrahepatic tumors) -No history of substance abuse (eg, cocaine, ethanol, salicylates, beta-blockers, pentamidine) -No history of nutritional disorders (eg, prolonged starvation before anesthesia, protein calorie malnutrition, low-calorie ketogenic diet, renal disease)   Physical Exam  There were no vitals taken for this visit.    Constitutional: well developed, well nourished Head: normocephalic, atraumatic Eyes: sclera anicteric, no redness Neck: supple Lungs: normal respiratory effort Neurology: alert and oriented Skin: dry, no appreciable rashes Musculoskeletal: no appreciable defects Psychiatric: normal mood and affect   Current Medications Patient's Medications  New Prescriptions   No medications on file  Previous Medications   ALBUTEROL (PROVENTIL HFA;VENTOLIN HFA) 108 (90 BASE) MCG/ACT INHALER    Inhale 2 puffs into the lungs every 4 (four) hours as needed for wheezing or shortness of breath.    AMPHETAMINE-DEXTROAMPHETAMINE (ADDERALL) 10 MG TABLET    Take 1 tablet (10 mg total) by mouth daily with breakfast.   AMPHETAMINE-DEXTROAMPHETAMINE (ADDERALL) 10 MG TABLET    Take 1 tablet (10 mg total) by mouth daily with breakfast.   AMPHETAMINE-DEXTROAMPHETAMINE (ADDERALL) 10 MG TABLET    Take 1 tablet (10 mg total) by mouth daily with breakfast.   AMPHETAMINE-DEXTROAMPHETAMINE (ADDERALL) 10 MG TABLET    Take 1 tablet (10 mg total) by mouth daily with breakfast.  BLOOD GLUCOSE MONITORING SUPPL DEVI    1 each by Does not apply route in the morning, at noon, and at bedtime. May substitute to any manufacturer covered by patient's insurance.   BUSPIRONE (BUSPAR) 5 MG TABLET    Take 1 tablet (5 mg total) by mouth 2 (two) times daily.   CONTINUOUS GLUCOSE SENSOR (DEXCOM G7 SENSOR) MISC    1 Device by Does not apply route continuous.   CONTINUOUS GLUCOSE SENSOR (FREESTYLE LIBRE 3 PLUS SENSOR) MISC    Change sensor every 15 days.   EPINEPHRINE 0.3 MG/0.3 ML IJ SOAJ INJECTION    Inject 0.3 mg into the muscle once as needed (for severe allergic reaction).    FERROUS SULFATE (IRON PO)    Take by mouth.   GLUCOSE BLOOD (BLOOD GLUCOSE TEST STRIPS) STRP    1 each by In Vitro route in the morning, at noon, and at bedtime. May substitute to any manufacturer covered by patient's insurance.   LORATADINE (CLARITIN) 10 MG TABLET     Take 10 mg by mouth daily as needed for allergies.   MULTIPLE VITAMIN (MULTI VITAMIN DAILY PO)    Take by mouth.   SYRINGE-NEEDLE, DISP, 3 ML (LUER LOCK SAFETY SYRINGES) 25G X 1" 3 ML MISC    1 each by Does not apply route once a week.   USTEKINUMAB (STELARA) 90 MG/ML SOSY INJECTION    Inject into the skin.   VITAMIN D PO    Take by mouth.  Modified Medications   No medications on file  Discontinued Medications   No medications on file    Allergies Allergies  Allergen Reactions   Ciprofloxacin     Other reaction(s): Dizziness, severe dizziness   Iodine Anaphylaxis    Other reaction(s): anaphylaxis   Oseltamivir     Other reaction(s): Other (See Comments) Bloody diarrhea Other reaction(s): bloody diarrhea   Penicillins Hives, Shortness Of Breath, Other (See Comments) and Anaphylaxis    Has patient had a PCN reaction causing immediate rash, facial/tongue/throat swelling, SOB or lightheadedness with hypotension: Yes Has patient had a PCN reaction causing severe rash involving mucus membranes or skin necrosis: No Has patient had a PCN reaction that required hospitalization No Has patient had a PCN reaction occurring within the last 10 years: YES If all of the above answers are "NO", then may proceed with Cephalosporin use.  Has patient had a PCN reaction causing immediate rash, facial/tongue/throat swelling, SOB or lightheadedness with hypotension: Yes  Other reaction(s): cardiac arrest/anaphlaxis   Shellfish Allergy Anaphylaxis   Shellfish-Derived Products Anaphylaxis    Other reaction(s): cardiac arrest/anaphlaxis Cardiac Arrest   Adalimumab     Other reaction(s): caused cirrhosis, Dizziness   Bactroban [Mupirocin Calcium] Hives   Egg-Derived Products     Other reaction(s): vomiting   Metronidazole     Other reaction(s): Dizziness, severe dizziness   Other     Other reaction(s): Vomiting Eggs   Mupirocin Hives and Rash    Other reaction(s): unknown    Past Medical  History Past Medical History:  Diagnosis Date   Allergy    Anemia    Anxiety    Asthma    Psoriasis    Pyelonephritis    in grade school   Ulcer February 2021   Ulcerative colitis    Past Surgical History Past Surgical History:  Procedure Laterality Date   CESAREAN SECTION N/A 02/16/2013   Procedure: CESAREAN SECTION;  Surgeon: Geryl Rankins, MD;  Location: [facility];  Service: Obstetrics;  Laterality: N/A;   CESAREAN SECTION N/A 09/23/2016   Procedure: REPEAT CESAREAN SECTION;  Surgeon: Geryl Rankins, MD;  Location: [facility];  Service: Obstetrics;  Laterality: N/A;  EDD 09/30/2016   EYE SURGERY     contacts surgically implanted    Family History family history includes ALS in her maternal uncle; Alcohol abuse in her paternal grandfather; Asthma in her daughter and mother; Birth defects in her brother; COPD in her maternal grandmother; Cancer in her maternal aunt, maternal grandfather, maternal grandmother, and mother; Diabetes in her maternal grandmother; Heart disease in her father and maternal grandmother; Hyperlipidemia in her paternal grandfather; Hypertension in her father and maternal grandmother; Stroke in her maternal grandmother, paternal grandmother, and paternal uncle.  Social History Social History   Socioeconomic History   Marital status: Married    Spouse name: Not on file   Number of children: 2   Years of education: Not on file   Highest education level: Not on file  Occupational History   Not on file  Tobacco Use   Smoking status: Never   Smokeless tobacco: Never   Tobacco comments:    Never used it  Vaping Use   Vaping status: Never Used  Substance and Sexual Activity   Alcohol use: Not Currently   Drug use: Never   Sexual activity: Yes    Birth control/protection: Condom  Other Topics Concern   Not on file  Social History Narrative   Ambulance person   Social Drivers of Health   Financial Resource Strain: Not on file  Food Insecurity:  Not on file  Transportation Needs: Not on file  Physical Activity: Not on file  Stress: Not on file  Social Connections: Not on file  Intimate Partner Violence: Not on file    No results found for: "CHOL" No results found for: "HDL" No results found for: "LDLCALC" No results found for: "TRIG" No results found for: "CHOLHDL" Lab Results  Component Value Date   CREATININE 0.79 03/30/2023   Lab Results  Component Value Date   GFR 95.75 03/30/2023      Component Value Date/Time   NA 138 03/30/2023 0811   K 3.8 03/30/2023 0811   CL 106 03/30/2023 0811   CO2 25 03/30/2023 0811   GLUCOSE 86 03/30/2023 0811   BUN 8 03/30/2023 0811   CREATININE 0.79 03/30/2023 0811   CREATININE 0.69 02/27/2023 1616   CALCIUM 8.9 03/30/2023 0811   PROT 6.3 (L) 06/10/2016 1250   ALBUMIN 3.2 (L) 06/10/2016 1250   AST 19 06/10/2016 1250   ALT 7 (L) 06/10/2016 1250   ALKPHOS 77 06/10/2016 1250   BILITOT 0.5 06/10/2016 1250   GFRNONAA >60 06/10/2016 1250   GFRAA >60 06/10/2016 1250      Latest Ref Rng & Units 03/30/2023    8:11 AM 03/10/2023    8:23 AM 02/27/2023    4:16 PM  BMP  Glucose 70 - 99 mg/dL 86  89  85   BUN 6 - 23 mg/dL 8   12   Creatinine 1.61 - 1.20 mg/dL 0.96   0.45   BUN/Creat Ratio 6 - 22 (calc)   SEE NOTE:   Sodium 135 - 145 mEq/L 138   138   Potassium 3.5 - 5.1 mEq/L 3.8   3.7   Chloride 96 - 112 mEq/L 106   105   CO2 19 - 32 mEq/L 25   25   Calcium 8.4 - 10.5 mg/dL 8.9   9.0  Component Value Date/Time   WBC 8.2 02/27/2023 1616   RBC 4.30 02/27/2023 1616   HGB 12.0 02/27/2023 1616   HCT 37.6 02/27/2023 1616   PLT 320 02/27/2023 1616   MCV 87.4 02/27/2023 1616   MCH 27.9 02/27/2023 1616   MCHC 31.9 (L) 02/27/2023 1616   RDW 13.0 02/27/2023 1616   LYMPHSABS 2,001 02/27/2023 1616   MONOABS 0.5 09/13/2016 0930   EOSABS 262 02/27/2023 1616   BASOSABS 57 02/27/2023 1616   Lab Results  Component Value Date   TSH 1.31 02/27/2023         Parts of this note  may have been dictated using voice recognition software. There may be variances in spelling and vocabulary which are unintentional. Not all errors are proofread. Please notify the Thereasa Parkin if any discrepancies are noted or if the meaning of any statement is not clear.

## 2023-06-12 ENCOUNTER — Encounter: Payer: Self-pay | Admitting: "Endocrinology

## 2023-06-15 ENCOUNTER — Other Ambulatory Visit: Payer: Self-pay | Admitting: "Endocrinology

## 2023-06-15 NOTE — Progress Notes (Signed)
    Added [facility] records per pt's request.

## 2023-07-21 ENCOUNTER — Other Ambulatory Visit: Payer: Self-pay | Admitting: Family Medicine

## 2023-07-21 DIAGNOSIS — F900 Attention-deficit hyperactivity disorder, predominantly inattentive type: Secondary | ICD-10-CM

## 2023-07-22 ENCOUNTER — Encounter: Payer: Self-pay | Admitting: *Deleted

## 2023-07-22 ENCOUNTER — Other Ambulatory Visit: Payer: Self-pay | Admitting: Family Medicine

## 2023-07-22 MED ORDER — AMPHETAMINE-DEXTROAMPHETAMINE 10 MG PO TABS: 10.0000 mg | ORAL_TABLET | Freq: Every day | ORAL | 0 refills | Status: DC

## 2023-07-22 MED ORDER — ALBUTEROL SULFATE HFA 108 (90 BASE) MCG/ACT IN AERS: 2.0000 | INHALATION_SPRAY | RESPIRATORY_TRACT | 1 refills | Status: DC | PRN

## 2023-07-22 NOTE — Telephone Encounter (Signed)
 The stelara has to come from GI.  Needs to get her 6 mo f/u w/me sch for May for adderall refills

## 2023-07-22 NOTE — Telephone Encounter (Signed)
Patient notified of messages below.

## 2023-09-20 ENCOUNTER — Other Ambulatory Visit: Payer: Self-pay | Admitting: Family Medicine

## 2023-09-22 ENCOUNTER — Telehealth: Admitting: Physician Assistant

## 2023-09-22 DIAGNOSIS — J019 Acute sinusitis, unspecified: Secondary | ICD-10-CM | POA: Diagnosis not present

## 2023-09-22 DIAGNOSIS — B9789 Other viral agents as the cause of diseases classified elsewhere: Secondary | ICD-10-CM | POA: Diagnosis not present

## 2023-09-22 MED ORDER — PREDNISONE 10 MG (21) PO TBPK: ORAL_TABLET | ORAL | 0 refills | Status: DC

## 2023-09-22 NOTE — Progress Notes (Signed)
 I have spent 5 minutes in review of e-visit questionnaire, review and updating patient chart, medical decision making and response to patient.   Piedad Climes, [name]

## 2023-09-22 NOTE — Progress Notes (Signed)
 E-Visit for Sinus Problems  We are sorry that you are not feeling well.  Here is how we plan to help!  Based on what you have shared with me it looks like you have sinusitis.  Sinusitis is inflammation and infection in the sinus cavities of the head.  Based on your presentation I believe you most likely have Acute Viral Sinusitis.This is an infection most likely caused by a virus. There is not specific treatment for viral sinusitis other than to help you with the symptoms until the infection runs its course.  You may use an oral decongestant such as Mucinex D or if you have glaucoma or high blood pressure use plain Mucinex. Saline nasal spray help and can safely be used as often as needed for congestion.  I have prescribed a steroid pack to reduce sinus symptoms and hopefully speed improvement.  Some authorities believe that zinc  sprays or the use of Echinacea may shorten the course of your symptoms.  Sinus infections are not as easily transmitted as other respiratory infection, however we still recommend that you avoid close contact with loved ones, especially the very young and elderly.  Remember to wash your hands thoroughly throughout the day as this is the number one way to prevent the spread of infection!  Home Care: Only take medications as instructed by your medical team. Do not take these medications with alcohol. A steam or ultrasonic humidifier can help congestion.  You can place a towel over your head and breathe in the steam from hot water coming from a faucet. Avoid close contacts especially the very young and the elderly. Cover your mouth when you cough or sneeze. Always remember to wash your hands.  Get Help Right Away If: You develop worsening fever or sinus pain. You develop a severe head ache or visual changes. Your symptoms persist after you have completed your treatment plan.  Make sure you Understand these instructions. Will watch your condition. Will get help right  away if you are not doing well or get worse.   Thank you for choosing an e-visit.  Your e-visit answers were reviewed by a board certified advanced clinical practitioner to complete your personal care plan. Depending upon the condition, your plan could have included both over the counter or prescription medications.  Please review your pharmacy choice. Make sure the pharmacy is open so you can pick up prescription now. If there is a problem, you may contact your provider through [employer] and have the prescription routed to another pharmacy.  Your safety is important to us . If you have drug allergies check your prescription carefully.   For the next 24 hours you can use MyChart to ask questions about today's visit, request a non-urgent call back, or ask for a work or school excuse. You will get an email in the next two days asking about your experience. I hope that your e-visit has been valuable and will speed your recovery.

## 2023-10-30 ENCOUNTER — Telehealth: Admitting: Family Medicine

## 2023-10-30 DIAGNOSIS — B3731 Acute candidiasis of vulva and vagina: Secondary | ICD-10-CM

## 2023-10-30 MED ORDER — FLUCONAZOLE 150 MG PO TABS: 150.0000 mg | ORAL_TABLET | Freq: Every day | ORAL | 0 refills | Status: AC

## 2023-10-30 NOTE — Progress Notes (Signed)

## 2023-11-06 ENCOUNTER — Ambulatory Visit (INDEPENDENT_AMBULATORY_CARE_PROVIDER_SITE_OTHER): Admitting: Internal Medicine

## 2023-11-06 ENCOUNTER — Other Ambulatory Visit (INDEPENDENT_AMBULATORY_CARE_PROVIDER_SITE_OTHER)

## 2023-11-06 ENCOUNTER — Encounter: Payer: Self-pay | Admitting: Internal Medicine

## 2023-11-06 VITALS — BP 94/60 | HR 88 | Ht 68.5 in | Wt 191.4 lb

## 2023-11-06 DIAGNOSIS — K5150 Left sided colitis without complications: Secondary | ICD-10-CM | POA: Diagnosis not present

## 2023-11-06 DIAGNOSIS — Z79620 Long term (current) use of immunosuppressive biologic: Secondary | ICD-10-CM | POA: Diagnosis not present

## 2023-11-06 DIAGNOSIS — L409 Psoriasis, unspecified: Secondary | ICD-10-CM

## 2023-11-06 DIAGNOSIS — Z5181 Encounter for therapeutic drug level monitoring: Secondary | ICD-10-CM | POA: Diagnosis not present

## 2023-11-06 DIAGNOSIS — K51511 Left sided colitis with rectal bleeding: Secondary | ICD-10-CM

## 2023-11-06 DIAGNOSIS — Z8639 Personal history of other endocrine, nutritional and metabolic disease: Secondary | ICD-10-CM

## 2023-11-06 DIAGNOSIS — Z862 Personal history of diseases of the blood and blood-forming organs and certain disorders involving the immune mechanism: Secondary | ICD-10-CM

## 2023-11-06 LAB — IBC + FERRITIN
Ferritin: 9.4 ng/mL — ABNORMAL LOW (ref 10.0–291.0)
Iron: 122 ug/dL (ref 42–145)
Saturation Ratios: 34.3 % (ref 20.0–50.0)
TIBC: 355.6 ug/dL (ref 250.0–450.0)
Transferrin: 254 mg/dL (ref 212.0–360.0)

## 2023-11-06 LAB — CBC WITH DIFFERENTIAL/PLATELET
Basophils Absolute: 0 10*3/uL (ref 0.0–0.1)
Basophils Relative: 0.7 % (ref 0.0–3.0)
Eosinophils Absolute: 0.2 10*3/uL (ref 0.0–0.7)
Eosinophils Relative: 2.9 % (ref 0.0–5.0)
HCT: 38.2 % (ref 36.0–46.0)
Hemoglobin: 12.6 g/dL (ref 12.0–15.0)
Lymphocytes Relative: 25.3 % (ref 12.0–46.0)
Lymphs Abs: 1.7 10*3/uL (ref 0.7–4.0)
MCHC: 33.1 g/dL (ref 30.0–36.0)
MCV: 84.2 fl (ref 78.0–100.0)
Monocytes Absolute: 0.6 10*3/uL (ref 0.1–1.0)
Monocytes Relative: 8.4 % (ref 3.0–12.0)
Neutro Abs: 4.3 10*3/uL (ref 1.4–7.7)
Neutrophils Relative %: 62.7 % (ref 43.0–77.0)
Platelets: 330 10*3/uL (ref 150.0–400.0)
RBC: 4.54 Mil/uL (ref 3.87–5.11)
RDW: 14.1 % (ref 11.5–15.5)
WBC: 6.9 10*3/uL (ref 4.0–10.5)

## 2023-11-06 LAB — COMPREHENSIVE METABOLIC PANEL WITH GFR
ALT: 6 U/L (ref 0–35)
AST: 14 U/L (ref 0–37)
Albumin: 4.1 g/dL (ref 3.5–5.2)
Alkaline Phosphatase: 87 U/L (ref 39–117)
BUN: 12 mg/dL (ref 6–23)
CO2: 25 meq/L (ref 19–32)
Calcium: 9 mg/dL (ref 8.4–10.5)
Chloride: 103 meq/L (ref 96–112)
Creatinine, Ser: 0.76 mg/dL (ref 0.40–1.20)
GFR: 99.88 mL/min (ref >60.00)
Glucose, Bld: 100 mg/dL — ABNORMAL HIGH (ref 70–99)
Potassium: 3.7 meq/L (ref 3.5–5.1)
Sodium: 134 meq/L — ABNORMAL LOW (ref 135–145)
Total Bilirubin: 0.3 mg/dL (ref 0.2–1.2)
Total Protein: 6.8 g/dL (ref 6.0–8.3)

## 2023-11-06 LAB — FOLATE: Folate: 13.4 ng/mL (ref >5.9)

## 2023-11-06 LAB — VITAMIN B12: Vitamin B-12: 227 pg/mL (ref 211–911)

## 2023-11-06 LAB — C-REACTIVE PROTEIN: CRP: <1 mg/dL (ref 0.5–20.0)

## 2023-11-06 LAB — VITAMIN D 25 HYDROXY (VIT D DEFICIENCY, FRACTURES): VITD: 22.11 ng/mL — ABNORMAL LOW (ref 30.00–100.00)

## 2023-11-06 NOTE — Progress Notes (Signed)
 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 Her PCP also empirically treated with 7 days of cipro/flagyl, with no effect. - Colonoscopy 09/21/2019: inflammed and ulcerated mucosa in the rectum. Biopsies showed mildly active chronic active proctitis, inactive chronic colitis in the sigmoid and descending (endoscopically normal). The TI and rest of colon were histologically normal.  - 09/2019: started mesalamine 2.4g daily with partial improvement. - 11/23/19: Canasa suppositories were added. Due to insurance/cost reasons she transitioned to mesalamine 800mg  BID. This did not fully improve her symptoms. - 08/15/20 CTAP was normal. - 08/22/2020 started anusol BID.  - Flex sig 08/31/2020: severely altered vascular pattern, congested, erythematous, friable mucosa in the rectum. Biopsies showed mildly active chronic colitis without dysplasia or granuloma.Transitioned to humira with induction followed by 40mg  every 14 days maintenance dosing + prednisone  taper. - 10/2020: developed pus filled bumps and flaking rash from her head to toes. This was biopsied by a dermatology (she follows with Ceasar Codding, PA, in [place]), and reportedly showed psoriasis. This was felt to be related to her adalimumab, which she stopped in December 2022.  - Flex sig 12/2020 on Humira: reportedly still showed mildly active chronic colitis, despite clinical remission - 04/2021: Adalimumab stopped due to psoriasis. Rash did not improve despite stopping adalimumab,  not fully responsive to topical steroids.  - 07/2021: established care in [facility].  Started Stelara.   - 03/2023: established care with [facility]  Colon cancer in mother (diagnosed at age 95), maternal grandfather with many polyps and then colon cancer. Mother also had thyroid cancer.  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   Interval History: She got a letter from her health savings cards that they may no longer be paying for Stelara  in the future. It was suggested in this letter that Tremfya would be a cheaper alternative. Her drug plan was delayed in getting her last dose of Stelara out to her by spacing out to 10 weeks. This has been worrisome because it seems they are taking longer to get her Stelara sent out to her. Denies ab pain, diarrhea, or blood in the stools. Denies joint pains. Denies fevers or recent infections. She doesn't see a dermatologist regularly because she was last told that her psoriasis was well controlled. Denies skin lesions. Next dose of Stelara is due in early 11/2023. She did see her endocrinologist for her issues with hypoglycemia but was just told to eat a snack at night time if this were to occur. 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  Past Medical History:  Diagnosis Date   Allergy    Anemia    Anxiety    Asthma    Psoriasis    Pyelonephritis    in grade school   Ulcer February 2021   Ulcerative colitis   Past Surgical History:  Procedure Laterality Date   CESAREAN SECTION N/A 02/16/2013   Procedure: CESAREAN SECTION;  Surgeon: Johnn Najjar, MD;  Location: [facility];  Service: Obstetrics;  Laterality:  N/A;   CESAREAN SECTION N/A 09/23/2016   Procedure: REPEAT CESAREAN SECTION;  Surgeon: Johnn Najjar, MD;  Location: [facility];  Service: Obstetrics;  Laterality: N/A;  EDD 09/30/2016   EYE SURGERY     contacts surgically implanted    reports that she has never smoked. She has never used smokeless  tobacco. She reports that she does not currently use alcohol. She reports that she does not use drugs. family history includes ALS in her maternal uncle; Alcohol abuse in her paternal grandfather; Asthma in her daughter and mother; Birth defects in her brother; COPD in her maternal grandmother; Cancer in her maternal aunt, maternal grandfather, maternal grandmother, and mother; Diabetes in her maternal grandmother; Heart disease in her father and maternal grandmother; Hyperlipidemia in her paternal grandfather; Hypertension in her father and maternal grandmother; Stroke in her maternal grandmother, paternal grandmother, and paternal uncle. Allergies  Allergen Reactions   Ciprofloxacin     Other reaction(s): Dizziness, severe dizziness   Iodine Anaphylaxis    Other reaction(s): anaphylaxis   Oseltamivir      Other reaction(s): Other (See Comments) Bloody diarrhea Other reaction(s): bloody diarrhea   Penicillins Hives, Shortness Of Breath, Other (See Comments) and Anaphylaxis    Has patient had a PCN reaction causing immediate rash, facial/tongue/throat swelling, SOB or lightheadedness with hypotension: Yes Has patient had a PCN reaction causing severe rash involving mucus membranes or skin necrosis: No Has patient had a PCN reaction that required hospitalization No Has patient had a PCN reaction occurring within the last 10 years: YES If all of the above answers are NO, then may proceed with Cephalosporin use.  Has patient had a PCN reaction causing immediate rash, facial/tongue/throat swelling, SOB or lightheadedness with hypotension: Yes  Other reaction(s): cardiac arrest/anaphlaxis   Shellfish Allergy Anaphylaxis   Shellfish-Derived Products Anaphylaxis    Other reaction(s): cardiac arrest/anaphlaxis Cardiac Arrest   Adalimumab     Other reaction(s): caused cirrhosis, Dizziness   Bactroban [Mupirocin Calcium ] Hives   Egg-Derived Products     Other reaction(s): vomiting    Metronidazole     Other reaction(s): Dizziness, severe dizziness   Other     Other reaction(s): Vomiting Eggs   Mupirocin Hives and Rash    Other reaction(s): unknown      Outpatient Encounter Medications as of 11/06/2023  Medication Sig   albuterol  (VENTOLIN  HFA) 108 (90 Base) MCG/ACT inhaler INHALE 2 PUFFS INTO THE LUNGS EVERY 4 HOURS AS NEEDED FOR WHEEZING OR SHORTNESS OF BREATH.   amphetamine -dextroamphetamine  (ADDERALL) 10 MG tablet Take 1 tablet (10 mg total) by mouth daily with breakfast.   Blood Glucose Monitoring Suppl DEVI 1 each by Does not apply route in the morning, at noon, and at bedtime. May substitute to any manufacturer covered by patient's insurance.   busPIRone  (BUSPAR ) 5 MG tablet Take 1 tablet (5 mg total) by mouth 2 (two) times daily.   Ferrous Sulfate (IRON  PO) Take by mouth.   loratadine  (CLARITIN ) 10 MG tablet Take 10 mg by mouth daily as needed for allergies.   Multiple Vitamin (MULTI VITAMIN DAILY PO) Take by mouth.   SYRINGE-NEEDLE, DISP, 3 ML (LUER LOCK SAFETY SYRINGES) 25G X 1 3 ML MISC 1 each by Does not apply route once a week.   ustekinumab (STELARA) 90 MG/ML SOSY injection Inject into the skin.   VITAMIN D  PO Take by mouth.   amphetamine -dextroamphetamine  (ADDERALL) 10 MG tablet Take 1 tablet (10 mg total) by mouth daily with breakfast. (Patient not  taking: Reported on 11/06/2023)   amphetamine -dextroamphetamine  (ADDERALL) 10 MG tablet Take 1 tablet (10 mg total) by mouth daily with breakfast. (Patient not taking: Reported on 11/06/2023)   EPINEPHrine  0.3 mg/0.3 mL IJ SOAJ injection Inject 0.3 mg into the muscle once as needed (for severe allergic reaction).  (Patient not taking: Reported on 11/06/2023)   [DISCONTINUED] amphetamine -dextroamphetamine  (ADDERALL) 10 MG tablet Take 1 tablet (10 mg total) by mouth daily with breakfast.   [DISCONTINUED] Continuous Glucose Sensor (DEXCOM G7 SENSOR) MISC 1 Device by Does not apply route continuous.   [DISCONTINUED]  Continuous Glucose Sensor (FREESTYLE LIBRE 3 PLUS SENSOR) MISC Change sensor every 15 days.   [DISCONTINUED] predniSONE  (STERAPRED UNI-PAK 21 TAB) 10 MG (21) TBPK tablet Take following package directions   No facility-administered encounter medications on file as of 11/06/2023.    PHYSICAL EXAM: BP 94/60 (BP Location: Left Arm, Patient Position: Sitting, Cuff Size: Large)   Pulse 88   Ht 5' 8.5 (1.74 m) Comment: height measured without shoes  Wt 191 lb 6 oz (86.8 kg)   LMP 10/16/2023   BMI 28.68 kg/m  General: Well developed white female in no acute distress CV: Regular rate Resp: CTAB Abd: Non-tender, non-distended  ASSESSMENT AND PLAN: Ms. Fleishman is a 38 y.o. 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 Clinical remission on stelara, and significant improvement of psoriasis.  1. Left sided ulcerative colitis with rectal bleeding c/b adalimumab related psoriasis, currently on ustekinumab since 07/2021. Patient has been doing very well on this therapy but recently got a letter from her health savings plan that they may stop covering Stelara since Tremfya is a cheaper alternative. Since patient is doing clinically well on Stelara, I do not think there is any need to switch her to an alternative medication regimen at this time. Switching from a therapy that is working would help exhaust potential available IBD therapies. Will get her set up for a colonoscopy to confirm endoscopic and histologic remission. - Check CBC, CMP, vitamin B12, folate, vitamin D , ferritin/IBC, CRP, quant gold - Continue Stelara for now - Colonoscopy [facility] - Recommended she do an annual visit with dermatology - Follows with OB/GYN regularly  2. History of IDA. Last CBC showed anemia had resolved  3. Psoriasis. Improved on stelara.  4. History of B12 deficiency: Was previously on monthly injections, but recent level was in the  300s.  Will recheck today  5. History of vitamin D  deficiency:  on supplements  Regino Caprio, MD  I spent 33 minutes of time, including in depth chart review, independent review of results as outlined above, communicating results with the patient directly, face-to-face time with the patient, coordinating care, and ordering studies and medications as appropriate, and documentation.

## 2023-11-06 NOTE — Patient Instructions (Addendum)
 _______________________________________________________  If your blood pressure at your visit was 140/90 or greater, please contact your primary care physician to follow up on this.  _______________________________________________________  If you are age 38 or older, your body mass index should be between 23-30. Your Body mass index is 28.68 kg/m. If this is out of the aforementioned range listed, please consider follow up with your Primary Care Provider.  If you are age 38 or younger, your body mass index should be between 19-25. Your Body mass index is 28.68 kg/m. If this is out of the aformentioned range listed, please consider follow up with your Primary Care Provider.   ________________________________________________________  The  GI providers would like to encourage you to use MYCHART to communicate with providers for non-urgent requests or questions.  Due to long hold times on the telephone, sending your provider a message by [facility] may be a faster and more efficient way to get a response.  Please allow 48 business hours for a response.  Please remember that this is for non-urgent requests.  _______________________________________________________  Your provider has requested that you go to the basement level for lab work before leaving today. Press B on the elevator. The lab is located at the first door on the left as you exit the elevator.  You have been scheduled for a colonoscopy. Please follow written instructions given to you at your visit today.   If you use inhalers (even only as needed), please bring them with you on the day of your procedure.  DO NOT TAKE 7 DAYS PRIOR TO TEST- Trulicity (dulaglutide) Ozempic, Wegovy (semaglutide) Mounjaro (tirzepatide) Bydureon Bcise (exanatide extended release)  DO NOT TAKE 1 DAY PRIOR TO YOUR TEST Rybelsus (semaglutide) Adlyxin (lixisenatide) Victoza (liraglutide) Byetta  (exanatide) ___________________________________________________________________________   It was a pleasure to see you today!  Thank you for trusting me with your gastrointestinal care!

## 2023-11-09 ENCOUNTER — Ambulatory Visit: Payer: Self-pay | Admitting: Internal Medicine

## 2023-11-09 DIAGNOSIS — E559 Vitamin D deficiency, unspecified: Secondary | ICD-10-CM

## 2023-11-09 MED ORDER — VITAMIN D (ERGOCALCIFEROL) 1.25 MG (50000 UNIT) PO CAPS: 50000.0000 [IU] | ORAL_CAPSULE | ORAL | 3 refills | Status: AC

## 2023-11-11 LAB — QUANTIFERON-TB GOLD PLUS
Mitogen-NIL: >10 [IU]/mL
NIL: 0.04 [IU]/mL
QuantiFERON-TB Gold Plus: NEGATIVE
TB1-NIL: 0 [IU]/mL
TB2-NIL: <0 [IU]/mL

## 2023-11-17 ENCOUNTER — Other Ambulatory Visit (HOSPITAL_COMMUNITY): Payer: Self-pay

## 2023-11-17 MED ORDER — USTEKINUMAB 90 MG/ML ~~LOC~~ SOSY: 90.0000 mg | PREFILLED_SYRINGE | SUBCUTANEOUS | 5 refills | Status: DC

## 2023-11-17 NOTE — Telephone Encounter (Signed)
 noted

## 2023-11-17 NOTE — Telephone Encounter (Signed)
 Note from [facility] shows that there is currently an active PA on file. Nothing further needed  from PA Team at this time.

## 2023-11-17 NOTE — Telephone Encounter (Signed)
 Can we submit PA for Stelara 90 mg every 8 weeks if needed - continuation of therapy. Thanks

## 2023-11-25 ENCOUNTER — Encounter: Payer: Self-pay | Admitting: Internal Medicine

## 2023-12-03 ENCOUNTER — Encounter: Payer: Self-pay | Admitting: Internal Medicine

## 2023-12-03 ENCOUNTER — Ambulatory Visit: Admitting: Internal Medicine

## 2023-12-03 VITALS — BP 121/56 | HR 83 | Temp 97.9°F | Resp 10 | Ht 68.5 in | Wt 191.0 lb

## 2023-12-03 DIAGNOSIS — K648 Other hemorrhoids: Secondary | ICD-10-CM

## 2023-12-03 DIAGNOSIS — K5150 Left sided colitis without complications: Secondary | ICD-10-CM

## 2023-12-03 MED ORDER — SODIUM CHLORIDE 0.9 % IV SOLN: 500.0000 mL | Freq: Once | INTRAVENOUS | Status: DC

## 2023-12-03 NOTE — Progress Notes (Signed)
 Called to room to assist during endoscopic procedure.  Patient ID and intended procedure confirmed with present staff. Received instructions for my participation in the procedure from the performing physician.

## 2023-12-03 NOTE — Progress Notes (Signed)
 Pt's states no medical or surgical changes since previsit or office visit.

## 2023-12-03 NOTE — Progress Notes (Signed)
 A/o x 3, VSS, gd SR's, pleased with anesthesia, report to RN

## 2023-12-03 NOTE — Patient Instructions (Addendum)
 It looks like your ulcerative colitis is responding very well to Stelara  therapy.  Resume previous diet Continue present medications Await pathology results Return to GI clinic in 6 months for follow up.     YOU HAD AN ENDOSCOPIC PROCEDURE TODAY AT THE [facility]:   Refer to the procedure report that was given to you for any specific questions about what was found during the examination.  If the procedure report does not answer your questions, please call your gastroenterologist to clarify.  If you requested that your care partner not be given the details of your procedure findings, then the procedure report has been included in a sealed envelope for you to review at your convenience later.  YOU SHOULD EXPECT: Some feelings of bloating in the abdomen. Passage of more gas than usual.  Walking can help get rid of the air that was put into your GI tract during the procedure and reduce the bloating. If you had a lower endoscopy (such as a colonoscopy or flexible sigmoidoscopy) you may notice spotting of blood in your stool or on the toilet paper. If you underwent a bowel prep for your procedure, you may not have a normal bowel movement for a few days.  Please Note:  You might notice some irritation and congestion in your nose or some drainage.  This is from the oxygen used during your procedure.  There is no need for concern and it should clear up in a day or so.  SYMPTOMS TO REPORT IMMEDIATELY:  Following lower endoscopy (colonoscopy):  Excessive amounts of blood in the stool  Significant tenderness or worsening of abdominal pains  Swelling of the abdomen that is new, acute  Fever of 100F or higher  For urgent or emergent issues, a gastroenterologist can be reached at any hour by calling [phone]. Do not use MyChart messaging for urgent concerns.    DIET:  We do recommend a small meal at first, but then you may proceed to your regular diet.  Drink plenty of fluids but you  should avoid alcoholic beverages for 24 hours.  ACTIVITY:  You should plan to take it easy for the rest of today and you should NOT DRIVE or use heavy machinery until tomorrow (because of the sedation medicines used during the test).    FOLLOW UP: Our staff will call the number listed on your records the next business day following your procedure.  We will call around 7:15- 8:00 am to check on you and address any questions or concerns that you may have regarding the information given to you following your procedure. If we do not reach you, we will leave a message.     If any biopsies were taken you will be contacted by phone or by letter within the next 1-3 weeks.  Please call us  at [phone] if you have not heard about the biopsies in 3 weeks.    SIGNATURES/CONFIDENTIALITY: You and/or your care partner have signed paperwork which will be entered into your electronic medical record.  These signatures attest to the fact that that the information above on your After Visit Summary has been reviewed and is understood.  Full responsibility of the confidentiality of this discharge information lies with you and/or your care-partner.

## 2023-12-03 NOTE — Op Note (Signed)
 [facility] Patient Name: Natalie Barker Procedure Date: 12/03/2023 12:56 PM MRN: [id number] Endoscopist: Rosario Estefana Kidney , , [id number] Age: 38 Referring MD:  Date of Birth: July 11, 1985 Gender: Female Account #: [id number] Procedure:                Colonoscopy Indications:              Follow-up of left-sided chronic ulcerative colitis Medicines:                Monitored Anesthesia Care Procedure:                Pre-Anesthesia Assessment:                           - Prior to the procedure, a History and Physical                            was performed, and patient medications and                            allergies were reviewed. The patient's tolerance of                            previous anesthesia was also reviewed. The risks                            and benefits of the procedure and the sedation                            options and risks were discussed with the patient.                            All questions were answered, and informed consent                            was obtained. Prior Anticoagulants: The patient has                            taken no anticoagulant or antiplatelet agents. ASA                            Grade Assessment: II - A patient with mild systemic                            disease. After reviewing the risks and benefits,                            the patient was deemed in satisfactory condition to                            undergo the procedure.                           After obtaining informed consent, the colonoscope  was passed under direct vision. Throughout the                            procedure, the patient's blood pressure, pulse, and                            oxygen saturations were monitored continuously. The                            Olympus Scope SN: [id number] was introduced through                            the anus and advanced to the the terminal ileum.                             The colonoscopy was performed without difficulty.                            The patient tolerated the procedure well. The                            quality of the bowel preparation was excellent. The                            terminal ileum, ileocecal valve, appendiceal                            orifice, and rectum were photographed. Scope In: 1:23:23 PM Scope Out: 1:44:09 PM Scope Withdrawal Time: 0 hours 15 minutes 20 seconds  Total Procedure Duration: 0 hours 20 minutes 46 seconds  Findings:                 The terminal ileum appeared normal.                           Inflammation was not found based on the endoscopic                            appearance of the mucosa in the colon. This was                            graded as Mayo Score 0 (normal or inactive                            disease), and when compared to the previous                            examination, the findings are improved. Biopsies                            were taken with a cold forceps for histology.                           Non-bleeding internal hemorrhoids were found during  retroflexion. Complications:            No immediate complications. Estimated Blood Loss:     Estimated blood loss was minimal. Impression:               - The examined portion of the ileum was normal.                           - Inactive (Mayo Score 0) ulcerative colitis,                            improved since the last examination. Biopsied.                           - Non-bleeding internal hemorrhoids. Recommendation:           - Discharge patient to home (with escort).                           - It looks like your ulcerative colitis is                            responding very well to Stelara  therapy.                           - Await pathology results.                           - Return to GI clinic in 6 months for follow up.                           - The findings and recommendations were  discussed                            with the patient. Dr Estefana Federico Rosario Estefana Federico,  12/03/2023 1:58:31 PM

## 2023-12-03 NOTE — Progress Notes (Signed)
 GASTROENTEROLOGY PROCEDURE H&P NOTE   Primary Care Physician: Wendolyn Jenkins Jansky, MD    Reason for Procedure:   Left sided ulcerative colitis  Plan:    Colonoscopy  Patient is appropriate for endoscopic procedure(s) in the ambulatory ([facility]) setting.  The nature of the procedure, as well as the risks, benefits, and alternatives were carefully and thoroughly reviewed with the patient. Ample time for discussion and questions allowed. The patient understood, was satisfied, and agreed to proceed.     HPI: Natalie Barker is a 38 y.o. female who presents for colonoscopy for evaluation of left sided ulcerative colitis.  Patient was most recently seen in the Gastroenterology Clinic on 11/06/23.  No interval change in medical history since that appointment. Please refer to that note for full details regarding GI history and clinical presentation.   Past Medical History:  Diagnosis Date   Allergy    Anemia    Anxiety    Asthma    Psoriasis    Pyelonephritis    in grade school   Ulcer February 2021   Ulcerative colitis    Past Surgical History:  Procedure Laterality Date   CESAREAN SECTION N/A 02/16/2013   Procedure: CESAREAN SECTION;  Surgeon: Hargis Paradise, MD;  Location: [facility];  Service: Obstetrics;  Laterality: N/A;   CESAREAN SECTION N/A 09/23/2016   Procedure: REPEAT CESAREAN SECTION;  Surgeon: Hargis Paradise, MD;  Location: [facility]IRTHING SUIT[facility];  Service: Obstetrics;  Laterality: N/A;  EDD 09/30/2016   EYE SURGERY     contacts surgically implanted    Prior to Admission medications   Medication Sig Start Date End Date Taking? Authorizing Provider  albuterol  (VENTOLIN  HFA) 108 (90 Base) MCG/ACT inhaler INHALE 2 PUFFS INTO THE LUNGS EVERY 4 HOURS AS NEEDED FOR WHEEZING OR SHORTNESS OF BREATH. 09/20/23  Yes Wendolyn Jenkins Jansky, MD  amphetamine -dextroamphetamine  (ADDERALL) 10 MG tablet Take 1 tablet (10 mg total) by mouth daily with breakfast. 09/20/23 12/03/23 Yes Wendolyn Jenkins Jansky, MD   busPIRone  (BUSPAR ) 5 MG tablet Take 1 tablet (5 mg total) by mouth 2 (two) times daily. 03/20/23  Yes Wendolyn Jenkins Jansky, MD  Vitamin D , Ergocalciferol , (DRISDOL ) 1.25 MG (50000 UNIT) CAPS capsule Take 1 capsule (50,000 Units total) by mouth every 7 (seven) days. 11/09/23  Yes Federico Rosario BROCKS, MD  amphetamine -dextroamphetamine  (ADDERALL) 10 MG tablet Take 1 tablet (10 mg total) by mouth daily with breakfast. Patient not taking: Reported on 11/06/2023 07/22/23 08/21/23  Wendolyn Jenkins Jansky, MD  amphetamine -dextroamphetamine  (ADDERALL) 10 MG tablet Take 1 tablet (10 mg total) by mouth daily with breakfast. Patient not taking: Reported on 11/06/2023 08/21/23 09/20/23  Wendolyn Jenkins Jansky, MD  EPINEPHrine  0.3 mg/0.3 mL IJ SOAJ injection Inject 0.3 mg into the muscle once as needed (for severe allergic reaction).  Patient not taking: No sig reported    [provider]  Ferrous Sulfate (IRON  PO) Take by mouth.    [provider]  loratadine  (CLARITIN ) 10 MG tablet Take 10 mg by mouth daily as needed for allergies.    [provider]  Multiple Vitamin (MULTI VITAMIN DAILY PO) Take by mouth.    [provider]  SYRINGE-NEEDLE, DISP, 3 ML (LUER LOCK SAFETY SYRINGES) 25G X 1 3 ML MISC 1 each by Does not apply route once a week. 08/23/21   Wendolyn Jenkins Jansky, MD  ustekinumab  (STELARA ) 90 MG/ML SOSY injection Inject 1 mL (90 mg total) into the skin every 8 (eight) weeks. 11/17/23   Federico Rosario  C, MD    Current Outpatient Medications  Medication Sig Dispense Refill   albuterol  (VENTOLIN  HFA) 108 (90 Base) MCG/ACT inhaler INHALE 2 PUFFS INTO THE LUNGS EVERY 4 HOURS AS NEEDED FOR WHEEZING OR SHORTNESS OF BREATH. 6.7 each 0   amphetamine -dextroamphetamine  (ADDERALL) 10 MG tablet Take 1 tablet (10 mg total) by mouth daily with breakfast. 30 tablet 0   busPIRone  (BUSPAR ) 5 MG tablet Take 1 tablet (5 mg total) by mouth 2 (two) times daily. 180 tablet 1   Vitamin D , Ergocalciferol , (DRISDOL ) 1.25  MG (50000 UNIT) CAPS capsule Take 1 capsule (50,000 Units total) by mouth every 7 (seven) days. 5 capsule 3   amphetamine -dextroamphetamine  (ADDERALL) 10 MG tablet Take 1 tablet (10 mg total) by mouth daily with breakfast. (Patient not taking: Reported on 11/06/2023) 30 tablet 0   amphetamine -dextroamphetamine  (ADDERALL) 10 MG tablet Take 1 tablet (10 mg total) by mouth daily with breakfast. (Patient not taking: Reported on 11/06/2023) 30 tablet 0   EPINEPHrine  0.3 mg/0.3 mL IJ SOAJ injection Inject 0.3 mg into the muscle once as needed (for severe allergic reaction).  (Patient not taking: No sig reported)     Ferrous Sulfate (IRON  PO) Take by mouth.     loratadine  (CLARITIN ) 10 MG tablet Take 10 mg by mouth daily as needed for allergies.     Multiple Vitamin (MULTI VITAMIN DAILY PO) Take by mouth.     SYRINGE-NEEDLE, DISP, 3 ML (LUER LOCK SAFETY SYRINGES) 25G X 1 3 ML MISC 1 each by Does not apply route once a week. 10 each 3   ustekinumab  (STELARA ) 90 MG/ML SOSY injection Inject 1 mL (90 mg total) into the skin every 8 (eight) weeks. 1 mL 5   Current Facility-Administered Medications  Medication Dose Route Frequency Provider Last Rate Last Admin   0.9 %  sodium chloride  infusion  500 mL Intravenous Once Federico Rosario BROCKS, MD        Allergies as of 12/03/2023 - Review Complete 12/03/2023  Allergen Reaction Noted   Ciprofloxacin  07/08/2021   Iodine Anaphylaxis 01/21/2013   Oseltamivir   07/08/2021   Penicillins Hives, Shortness Of Breath, Other (See Comments), and Anaphylaxis 01/21/2013   Shellfish allergy Anaphylaxis 01/21/2013   Shellfish-derived products Anaphylaxis 07/08/2021   Adalimumab Other (See Comments) 07/08/2021   Bactroban [mupirocin calcium ] Hives 01/21/2013   Metronidazole Other (See Comments) 07/08/2021   Egg-derived products Nausea And Vomiting 07/08/2021   Mupirocin Hives and Rash 01/21/2013    Family History  Problem Relation Age of Onset   Colon cancer Mother 26    Cancer Mother        thyroid at 51, colon ca at 52, breast ca at 46   Asthma Mother    Hypertension Father    Heart disease Father        heart attack, pacemaker   Birth defects Brother    Cancer Maternal Aunt        colon ca at 68   ALS Maternal Uncle    Stroke Paternal Uncle    Colon cancer Maternal Grandmother    Diabetes Maternal Grandmother    Heart disease Maternal Grandmother    COPD Maternal Grandmother    Stroke Maternal Grandmother    Hypertension Maternal Grandmother    Cancer Maternal Grandmother        thyroid ca at 22   Cancer Maternal Grandfather        colon ca at 35   Stroke Paternal Grandmother    Hyperlipidemia  Paternal Grandfather    Alcohol abuse Paternal Grandfather    Asthma Daughter    Esophageal cancer Neg Hx    Rectal cancer Neg Hx    Stomach cancer Neg Hx     Social History   Socioeconomic History   Marital status: Married    Spouse name: Not on file   Number of children: 2   Years of education: Not on file   Highest education level: Not on file  Occupational History   Not on file  Tobacco Use   Smoking status: Never   Smokeless tobacco: Never   Tobacco comments:    Never used it  Vaping Use   Vaping status: Never Used  Substance and Sexual Activity   Alcohol use: Not Currently   Drug use: Never   Sexual activity: Yes    Birth control/protection: Condom  Other Topics Concern   Not on file  Social History Narrative   Ambulance person   Social Drivers of Health   Financial Resource Strain: Not on file  Food Insecurity: Not on file  Transportation Needs: Not on file  Physical Activity: Not on file  Stress: Not on file  Social Connections: Not on file  Intimate Partner Violence: Not on file    Physical Exam: Vital signs in last 24 hours: BP 121/78   Pulse 72   Temp 97.9 F (36.6 C) (Temporal)   Ht 5' 8.5 (1.74 m)   Wt 191 lb (86.6 kg)   LMP 11/12/2023 (Approximate)   SpO2 100%   BMI 28.62 kg/m  GEN: NAD EYE:  Sclerae anicteric ENT: MMM CV: Non-tachycardic Pulm: No increased WOB GI: Soft NEURO:  Alert & Oriented   Estefana Kidney, MD [facility]   12/03/2023 1:15 PM

## 2023-12-04 ENCOUNTER — Telehealth: Payer: Self-pay

## 2023-12-04 NOTE — Telephone Encounter (Signed)
  Follow up Call-     12/03/2023    1:02 PM  Call back number  Post procedure Call Back phone  # [phone]  Permission to leave phone message Yes     Patient questions:  Do you have a fever, pain , or abdominal swelling? No. Pain Score  0 *  Have you tolerated food without any problems? Yes.    Have you been able to return to your normal activities? Yes.    Do you have any questions about your discharge instructions: Diet   No. Medications  No. Follow up visit  No.  Do you have questions or concerns about your Care? No.  Actions: * If pain score is 4 or above: No action needed, pain <4.

## 2023-12-08 ENCOUNTER — Ambulatory Visit: Payer: Self-pay | Admitting: Internal Medicine

## 2023-12-08 LAB — SURGICAL PATHOLOGY

## 2024-02-08 ENCOUNTER — Encounter: Payer: Self-pay | Admitting: Family Medicine

## 2024-02-09 ENCOUNTER — Telehealth: Admitting: Family Medicine

## 2024-02-09 ENCOUNTER — Encounter: Payer: Self-pay | Admitting: Family Medicine

## 2024-02-09 DIAGNOSIS — E538 Deficiency of other specified B group vitamins: Secondary | ICD-10-CM | POA: Diagnosis not present

## 2024-02-09 DIAGNOSIS — F900 Attention-deficit hyperactivity disorder, predominantly inattentive type: Secondary | ICD-10-CM

## 2024-02-09 DIAGNOSIS — F419 Anxiety disorder, unspecified: Secondary | ICD-10-CM

## 2024-02-09 DIAGNOSIS — K5150 Left sided colitis without complications: Secondary | ICD-10-CM

## 2024-02-09 MED ORDER — AMPHETAMINE-DEXTROAMPHETAMINE 10 MG PO TABS: 10.0000 mg | ORAL_TABLET | Freq: Every day | ORAL | 0 refills | Status: AC

## 2024-02-09 MED ORDER — LUER LOCK SAFETY SYRINGES 25G X 1" 3 ML MISC: 1.0000 | 3 refills | Status: AC

## 2024-02-09 MED ORDER — CYANOCOBALAMIN 1000 MCG/ML IJ SOLN: 1000.0000 ug | INTRAMUSCULAR | 3 refills | Status: AC

## 2024-02-09 NOTE — Patient Instructions (Signed)
It was very nice to see you today!  Great to see you   PLEASE NOTE:  If you had any lab tests please let us know if you have not heard back within a few days. You may see your results on MyChart before we have a chance to review them but we will give you a call once they are reviewed by [name]. If we ordered any referrals today, please let us know if you have not heard from their office within the next week.   Please try these tips to maintain a healthy lifestyle:  Eat most of your calories during the day when you are active. Eliminate processed foods including packaged sweets (pies, cakes, cookies), reduce intake of potatoes, white bread, white pasta, and white rice. Look for whole grain options, oat flour or almond flour.  Each meal should contain half fruits/vegetables, one quarter protein, and one quarter carbs (no bigger than a computer mouse).  Cut down on sweet beverages. This includes juice, soda, and sweet tea. Also watch fruit intake, though this is a healthier sweet option, it still contains natural sugar! Limit to 3 servings daily.  Drink at least 1 glass of water with each meal and aim for at least 8 glasses per day  Exercise at least 150 minutes every week.

## 2024-02-09 NOTE — Progress Notes (Signed)
 MyChart Video Visit Virtual Visit via Video Note   This visit type was conducted w/patient consent. This format is felt to be most appropriate for this patient at this time. Physical exam was limited by quality of the video and audio technology used for the visit. CMA was able to get the patient set up on a video visit.  Patient location: Work. Patient and provider in visit Provider location: Office  I discussed the limitations of evaluation and management by telemedicine and the availability of in person appointments. The patient expressed understanding and agreed to proceed.  Visit Date: 02/09/2024  Today's healthcare provider: Jenkins CHRISTELLA Carrel, MD     Subjective:    Patient ID: Natalie Barker, female    DOB: 10-24-1985, 38 y.o.   MRN: [id number]  Chief Complaint  Patient presents with   Medication Refill    Refill of amphetamine -dextroamphetamine     HPI Discussed the use of AI scribe software for clinical note transcription with the patient, who gave verbal consent to proceed.  History of Present Illness Natalie Barker is a 38 year old female who presents for follow-up on medications for ADD.  She is currently taking Adderall 10 mg once daily for ADD, which effectively maintains her focus without causing side effects such as heart racing, shaking, jitteriness, or suicidal thoughts.  She has been using buspirone  on an as-needed basis rather than daily as prescribed. She does not perceive a significant difference in her anxiety whether she takes it or not, and she has not been using it regularly.  She continues to take Stelara  for ulcerative colitis. She has previously used B12 injections, which she found effective, and her father administers the injections.      Past Medical History:  Diagnosis Date   Allergy    Anemia    Anxiety    Asthma    Psoriasis    Pyelonephritis    in grade school   Ulcer February 2021   Ulcerative colitis    Past Surgical  History:  Procedure Laterality Date   CESAREAN SECTION N/A 02/16/2013   Procedure: CESAREAN SECTION;  Surgeon: Hargis Paradise, MD;  Location: [facility];  Service: Obstetrics;  Laterality: N/A;   CESAREAN SECTION N/A 09/23/2016   Procedure: REPEAT CESAREAN SECTION;  Surgeon: Hargis Paradise, MD;  Location: [facility] BIRTHING SUIT[facility];  Service: Obstetrics;  Laterality: N/A;  EDD 09/30/2016   EYE SURGERY     contacts surgically implanted    Outpatient Medications Prior to Visit  Medication Sig Dispense Refill   albuterol  (VENTOLIN  HFA) 108 (90 Base) MCG/ACT inhaler INHALE 2 PUFFS INTO THE LUNGS EVERY 4 HOURS AS NEEDED FOR WHEEZING OR SHORTNESS OF BREATH. 6.7 each 0   busPIRone  (BUSPAR ) 5 MG tablet Take 1 tablet (5 mg total) by mouth 2 (two) times daily. 180 tablet 1   EPINEPHrine  0.3 mg/0.3 mL IJ SOAJ injection Inject 0.3 mg into the muscle once as needed (for severe allergic reaction).      Ferrous Sulfate (IRON  PO) Take by mouth.     loratadine  (CLARITIN ) 10 MG tablet Take 10 mg by mouth daily as needed for allergies.     Multiple Vitamin (MULTI VITAMIN DAILY PO) Take by mouth.     ustekinumab  (STELARA ) 90 MG/ML SOSY injection Inject 1 mL (90 mg total) into the skin every 8 (eight) weeks. 1 mL 5   Vitamin D , Ergocalciferol , (DRISDOL ) 1.25 MG (50000 UNIT) CAPS capsule Take 1 capsule (50,000 Units total) by mouth every 7 (  seven) days. 5 capsule 3   amphetamine -dextroamphetamine  (ADDERALL) 10 MG tablet Take 1 tablet (10 mg total) by mouth daily with breakfast. 30 tablet 0   amphetamine -dextroamphetamine  (ADDERALL) 10 MG tablet Take 1 tablet (10 mg total) by mouth daily with breakfast. 30 tablet 0   amphetamine -dextroamphetamine  (ADDERALL) 10 MG tablet Take 1 tablet (10 mg total) by mouth daily with breakfast. 30 tablet 0   SYRINGE-NEEDLE, DISP, 3 ML (LUER LOCK SAFETY SYRINGES) 25G X 1 3 ML MISC 1 each by Does not apply route once a week. 10 each 3   No facility-administered medications prior to visit.     Allergies  Allergen Reactions   Ciprofloxacin     Other reaction(s): Dizziness, severe dizziness   Iodine Anaphylaxis    Other reaction(s): anaphylaxis   Oseltamivir      Other reaction(s): Other (See Comments) Bloody diarrhea Other reaction(s): bloody diarrhea   Penicillins Hives, Shortness Of Breath, Other (See Comments) and Anaphylaxis    Has patient had a PCN reaction causing immediate rash, facial/tongue/throat swelling, SOB or lightheadedness with hypotension: Yes Has patient had a PCN reaction causing severe rash involving mucus membranes or skin necrosis: No Has patient had a PCN reaction that required hospitalization No Has patient had a PCN reaction occurring within the last 10 years: YES If all of the above answers are NO, then may proceed with Cephalosporin use.  Has patient had a PCN reaction causing immediate rash, facial/tongue/throat swelling, SOB or lightheadedness with hypotension: Yes  Other reaction(s): cardiac arrest/anaphlaxis   Shellfish Allergy Anaphylaxis   Shellfish-Derived Products Anaphylaxis    Other reaction(s): cardiac arrest/anaphlaxis Cardiac Arrest   Adalimumab Other (See Comments)    Other reaction(s): caused psoriasis, Dizziness   Bactroban [Mupirocin Calcium ] Hives   Metronidazole Other (See Comments)    Other reaction(s): Dizziness, severe dizziness   Egg-Derived Products Nausea And Vomiting    Other reaction(s): vomiting   Mupirocin Hives and Rash    Other reaction(s): unknown        Objective:     Physical Exam  Vitals and nursing note reviewed.  Constitutional:      General:  is not in acute distress.    Appearance: Normal appearance.  HENT:     Head: Normocephalic.  Pulmonary:     Effort: No respiratory distress.  Skin:    General: Skin is dry.     Coloration: Skin is not pale.  Neurological:     Mental Status: Pt is alert and oriented to person, place, and time.  Psychiatric:        Mood and Affect: Mood normal.    Pdmp checked.  Reviewed labs  There were no vitals taken for this visit.  Wt Readings from Last 3 Encounters:  12/03/23 191 lb (86.6 kg)  11/06/23 191 lb 6 oz (86.8 kg)  04/01/23 188 lb 3.2 oz (85.4 kg)       Assessment & Plan:   Problem List Items Addressed This Visit     Anxiety   Attention deficit hyperactivity disorder (ADHD), predominantly inattentive type - Primary   Left sided ulcerative colitis without complication (HCC)   Relevant Medications   SYRINGE-NEEDLE, DISP, 3 ML (LUER LOCK SAFETY SYRINGES) 25G X 1 3 ML MISC   cyanocobalamin  (VITAMIN B12) 1000 MCG/ML injection   Vitamin B 12 deficiency   Relevant Medications   SYRINGE-NEEDLE, DISP, 3 ML (LUER LOCK SAFETY SYRINGES) 25G X 1 3 ML MISC   cyanocobalamin  (VITAMIN B12) 1000 MCG/ML injection  Assessment  and Plan Assessment & Plan Ulcerative colitis   Continue Stelara  as prescribed.  Vitamin B12 deficiency   Vitamin B12 levels are low normal. Oral supplements are ineffective due to ulcerative colitis. She responds well to and prefers B12 injections, administered by her father. Administer Vitamin B12 injections and send prescription to [facility] [address] in [place].  Attention-deficit disorder   Attention-deficit disorder is well-managed with Adderall 10 mg daily. She reports good focus and no side effects. Continue Adderall 10 mg daily. Schedule an in-person appointment in three months for annual review and urine drug screen.  Anxiety disorder   Anxiety disorder is managed with buspirone  as needed. She reports no significant difference with or without it, indicating well-controlled anxiety. Continue buspirone  as needed.    Meds ordered this encounter  Medications   SYRINGE-NEEDLE, DISP, 3 ML (LUER LOCK SAFETY SYRINGES) 25G X 1 3 ML MISC    Sig: 1 each by Does not apply route once a week.    Dispense:  10 each    Refill:  3   cyanocobalamin  (VITAMIN B12) 1000 MCG/ML injection    Sig: Inject 1 mL (1,000 mcg  total) into the muscle every 30 (thirty) days.    Dispense:  3 mL    Refill:  3   amphetamine -dextroamphetamine  (ADDERALL) 10 MG tablet    Sig: Take 1 tablet (10 mg total) by mouth daily with breakfast.    Dispense:  30 tablet    Refill:  0   amphetamine -dextroamphetamine  (ADDERALL) 10 MG tablet    Sig: Take 1 tablet (10 mg total) by mouth daily with breakfast.    Dispense:  30 tablet    Refill:  0   amphetamine -dextroamphetamine  (ADDERALL) 10 MG tablet    Sig: Take 1 tablet (10 mg total) by mouth daily with breakfast.    Dispense:  30 tablet    Refill:  0    I discussed the assessment and treatment plan with the patient. The patient was provided an opportunity to ask questions and all were answered. The patient agreed with the plan and demonstrated an understanding of the instructions.   The patient was advised to call back or seek an in-person evaluation if the symptoms worsen or if the condition fails to improve as anticipated.  Return in about 3 months (around 05/10/2024) for ADD.  Jenkins CHRISTELLA Carrel, MD [facility] [phone] (phone) [phone] (fax)  [facility]

## 2024-02-15 ENCOUNTER — Telehealth: Admitting: Physician Assistant

## 2024-02-15 DIAGNOSIS — J020 Streptococcal pharyngitis: Secondary | ICD-10-CM | POA: Diagnosis not present

## 2024-02-15 MED ORDER — AZITHROMYCIN 250 MG PO TABS: ORAL_TABLET | ORAL | 0 refills | Status: AC

## 2024-02-15 NOTE — Progress Notes (Signed)
 I have spent 5 minutes in review of e-visit questionnaire, review and updating patient chart, medical decision making and response to patient.   [name], [name]

## 2024-02-15 NOTE — Progress Notes (Signed)
 E-Visit for Sore Throat - Strep Symptoms  We are sorry that you are not feeling well.  Here is how we plan to help!  Based on what you have shared with me it is likely that you have strep pharyngitis.  Strep pharyngitis is inflammation and infection in the back of the throat.  This is an infection cause by bacteria and is treated with antibiotics.  I have prescribed Azithromycin 250 mg two tablets today and then one daily for 4 additional days. For throat pain, we recommend over the counter oral pain relief medications such as acetaminophen or aspirin, or anti-inflammatory medications such as ibuprofen or naproxen sodium. Topical treatments such as oral throat lozenges or sprays may be used as needed. Strep infections are not as easily transmitted as other respiratory infections, however we still recommend that you avoid close contact with loved ones, especially the very young and elderly.  Remember to wash your hands thoroughly throughout the day as this is the number one way to prevent the spread of infection and wipe down door knobs and counters with disinfectant.   Home Care: Only take medications as instructed by your medical team. Complete the entire course of an antibiotic. Do not take these medications with alcohol. A steam or ultrasonic humidifier can help congestion.  You can place a towel over your head and breathe in the steam from hot water coming from a faucet. Avoid close contacts especially the very young and the elderly. Cover your mouth when you cough or sneeze. Always remember to wash your hands.  Get Help Right Away If: You develop worsening fever or sinus pain. You develop a severe head ache or visual changes. Your symptoms persist after you have completed your treatment plan.  Make sure you Understand these instructions. Will watch your condition. Will get help right away if you are not doing well or get worse.   Thank you for choosing an e-visit.  Your e-visit  answers were reviewed by a board certified advanced clinical practitioner to complete your personal care plan. Depending upon the condition, your plan could have included both over the counter or prescription medications.  Please review your pharmacy choice. Make sure the pharmacy is open so you can pick up prescription now. If there is a problem, you may contact your provider through [employer] and have the prescription routed to another pharmacy.  Your safety is important to [name]. If you have drug allergies check your prescription carefully.   For the next 24 hours you can use MyChart to ask questions about today's visit, request a non-urgent call back, or ask for a work or school excuse. You will get an email in the next two days asking about your experience. I hope that your e-visit has been valuable and will speed your recovery.

## 2024-02-25 ENCOUNTER — Telehealth: Admitting: Physician Assistant

## 2024-02-25 DIAGNOSIS — J4521 Mild intermittent asthma with (acute) exacerbation: Secondary | ICD-10-CM

## 2024-02-25 DIAGNOSIS — J208 Acute bronchitis due to other specified organisms: Secondary | ICD-10-CM

## 2024-02-25 DIAGNOSIS — B9689 Other specified bacterial agents as the cause of diseases classified elsewhere: Secondary | ICD-10-CM | POA: Diagnosis not present

## 2024-02-25 MED ORDER — DOXYCYCLINE HYCLATE 100 MG PO TABS: 100.0000 mg | ORAL_TABLET | Freq: Two times a day (BID) | ORAL | 0 refills | Status: AC

## 2024-02-25 MED ORDER — PREDNISONE 20 MG PO TABS: 40.0000 mg | ORAL_TABLET | Freq: Every day | ORAL | 0 refills | Status: AC

## 2024-02-25 MED ORDER — ALBUTEROL SULFATE HFA 108 (90 BASE) MCG/ACT IN AERS: 2.0000 | INHALATION_SPRAY | RESPIRATORY_TRACT | 0 refills | Status: AC | PRN

## 2024-02-25 MED ORDER — PROMETHAZINE-DM 6.25-15 MG/5ML PO SYRP: 5.0000 mL | ORAL_SOLUTION | Freq: Four times a day (QID) | ORAL | 0 refills | Status: AC | PRN

## 2024-02-25 NOTE — Patient Instructions (Signed)
 Natalie Barker, thank you for joining Natalie Velma Lunger, [name] for today's virtual visit.  While this provider is not your primary care provider (PCP), if your PCP is located in our provider database this encounter information will be shared with them immediately following your visit.   A [facility] MyChart account gives you access to today's visit and all your visits, tests, and labs performed at [facility]  click here if you don't have a [facility] MyChart account or go to [email or web address]  Consent: (Patient) Natalie Barker provided verbal consent for this virtual visit at the beginning of the encounter.  Current Medications:  Current Outpatient Medications:    albuterol  (VENTOLIN  HFA) 108 (90 Base) MCG/ACT inhaler, INHALE 2 PUFFS INTO THE LUNGS EVERY 4 HOURS AS NEEDED FOR WHEEZING OR SHORTNESS OF BREATH., Disp: 6.7 each, Rfl: 0   amphetamine -dextroamphetamine  (ADDERALL) 10 MG tablet, Take 1 tablet (10 mg total) by mouth daily with breakfast., Disp: 30 tablet, Rfl: 0   [START ON 03/10/2024] amphetamine -dextroamphetamine  (ADDERALL) 10 MG tablet, Take 1 tablet (10 mg total) by mouth daily with breakfast., Disp: 30 tablet, Rfl: 0   [START ON 04/09/2024] amphetamine -dextroamphetamine  (ADDERALL) 10 MG tablet, Take 1 tablet (10 mg total) by mouth daily with breakfast., Disp: 30 tablet, Rfl: 0   busPIRone  (BUSPAR ) 5 MG tablet, Take 1 tablet (5 mg total) by mouth 2 (two) times daily., Disp: 180 tablet, Rfl: 1   cyanocobalamin  (VITAMIN B12) 1000 MCG/ML injection, Inject 1 mL (1,000 mcg total) into the muscle every 30 (thirty) days., Disp: 3 mL, Rfl: 3   EPINEPHrine  0.3 mg/0.3 mL IJ SOAJ injection, Inject 0.3 mg into the muscle once as needed (for severe allergic reaction). , Disp: , Rfl:    Ferrous Sulfate (IRON  PO), Take by mouth., Disp: , Rfl:    loratadine  (CLARITIN ) 10 MG tablet, Take 10 mg by mouth daily as needed for allergies., Disp: , Rfl:    Multiple Vitamin  (MULTI VITAMIN DAILY PO), Take by mouth., Disp: , Rfl:    SYRINGE-NEEDLE, DISP, 3 ML (LUER LOCK SAFETY SYRINGES) 25G X 1 3 ML MISC, 1 each by Does not apply route once a week., Disp: 10 each, Rfl: 3   ustekinumab  (STELARA ) 90 MG/ML SOSY injection, Inject 1 mL (90 mg total) into the skin every 8 (eight) weeks., Disp: 1 mL, Rfl: 5   Vitamin D , Ergocalciferol , (DRISDOL ) 1.25 MG (50000 UNIT) CAPS capsule, Take 1 capsule (50,000 Units total) by mouth every 7 (seven) days., Disp: 5 capsule, Rfl: 3   Medications ordered in this encounter:  No orders of the defined types were placed in this encounter.    *If you need refills on other medications prior to your next appointment, please contact your pharmacy*  Follow-Up: Call back or seek an in-person evaluation if the symptoms worsen or if the condition fails to improve as anticipated.   Virtual Care [phone]  Other Instructions Take antibiotic (Doxycycline) as directed.  Increase fluids.  Get plenty of rest. Use Mucinex for congestion. I have sent in a refill of your albuterol . Use the prednisone  and cough syrup as directed. Take a daily probiotic (I recommend Align or Culturelle, but even Activia Yogurt may be beneficial).  A humidifier placed in the bedroom may offer some relief for a dry, scratchy throat of nasal irritation.  Read information below on acute bronchitis. If you note any non-resolving, new, or worsening symptoms despite treatment, please seek an in-person evaluation ASAP.  Acute Bronchitis  Bronchitis is when the airways that extend from the windpipe into the lungs get red, puffy, and painful (inflamed). Bronchitis often causes thick spit (mucus) to develop. This leads to a cough. A cough is the most common symptom of bronchitis. In acute bronchitis, the condition usually begins suddenly and goes away over time (usually in 2 weeks). Smoking, allergies, and asthma can make bronchitis worse. Repeated episodes of bronchitis  may cause more lung problems.  HOME CARE Rest. Drink enough fluids to keep your pee (urine) clear or pale yellow (unless you need to limit fluids as told by your doctor). Only take over-the-counter or prescription medicines as told by your doctor. Avoid smoking and secondhand smoke. These can make bronchitis worse. If you are a smoker, think about using nicotine gum or skin patches. Quitting smoking will help your lungs heal faster. Reduce the chance of getting bronchitis again by: Washing your hands often. Avoiding people with cold symptoms. Trying not to touch your hands to your mouth, nose, or eyes. Follow up with your doctor as told.  GET HELP IF: Your symptoms do not improve after 1 week of treatment. Symptoms include: Cough. Fever. Coughing up thick spit. Body aches. Chest congestion. Chills. Shortness of breath. Sore throat.  GET HELP RIGHT AWAY IF:  You have an increased fever. You have chills. You have severe shortness of breath. You have bloody thick spit (sputum). You throw up (vomit) often. You lose too much body fluid (dehydration). You have a severe headache. You faint.  MAKE SURE YOU:  Understand these instructions. Will watch your condition. Will get help right away if you are not doing well or get worse. Document Released: 10/29/2007 Document Revised: 01/12/2013 Document Reviewed: 11/02/2012 [facility], [facility], [facility] Patient Information 2015 [place], [place]. This information is not intended to replace advice given to you by your health care provider. Make sure you discuss any questions you have with your health care provider.    If you have been instructed to have an in-person evaluation today at a local Urgent Care facility, please use the link below. It will take you to a list of all of our available [facility], including address, phone number and hours of operation. Please do not delay care.  [facility]  If you or a family member do not  have a primary care provider, use the link below to schedule a visit and establish care. When you choose a [facility] primary care physician or advanced practice provider, you gain a long-term partner in health. Find a Primary Care Provider  Learn more about [facility] in-office and virtual care options: [facility] - Get Care Now

## 2024-02-25 NOTE — Progress Notes (Signed)
 Virtual Visit Consent   Natalie Barker, you are scheduled for a virtual visit with a [facility] provider today. Just as with appointments in the office, your consent must be obtained to participate. Your consent will be active for this visit and any virtual visit you may have with one of our providers in the next 365 days. If you have a MyChart account, a copy of this consent can be sent to you electronically.  As this is a virtual visit, video technology does not allow for your provider to perform a traditional examination. This may limit your provider's ability to fully assess your condition. If your provider identifies any concerns that need to be evaluated in person or the need to arrange testing (such as labs, EKG, etc.), we will make arrangements to do so. Although advances in technology are sophisticated, we cannot ensure that it will always work on either your end or our end. If the connection with a video visit is poor, the visit may have to be switched to a telephone visit. With either a video or telephone visit, we are not always able to ensure that we have a secure connection.  By engaging in this virtual visit, you consent to the provision of healthcare and authorize for your insurance to be billed (if applicable) for the services provided during this visit. Depending on your insurance coverage, you may receive a charge related to this service.  I need to obtain your verbal consent now. Are you willing to proceed with your visit today? Natalie Barker has provided verbal consent on 02/25/2024 for a virtual visit (video or telephone). Natalie Barker, [place]  Date: 02/25/2024 9:36 AM   Virtual Visit via Video Note   I, Natalie Barker, connected with  Natalie Barker  ([id number], April 04, 1986) on 02/25/24 at  9:30 AM EDT by a video-enabled telemedicine application and verified that I am speaking with the correct person using two identifiers.  Location: Patient: Virtual  Visit Location Patient: Home Provider: Virtual Visit Location Provider: Home Office   I discussed the limitations of evaluation and management by telemedicine and the availability of in person appointments. The patient expressed understanding and agreed to proceed.    History of Present Illness: Natalie Barker is a 38 y.o. who identifies as a female who was assigned female at birth, and is being seen today for chest congestion, cough that is productive of thick, green phlegm, chest tightness and wheezing. Has noted increased use of her albuterol  inhaler and will be in need of a refill. Denies fever, chills. Notes her severe sore throat resolved with Azithromycin . Is on Stelara  currently.   HPI: HPI  Problems:  Patient Active Problem List   Diagnosis Date Noted   Left sided ulcerative colitis without complication (HCC) 04/16/2023   Family history of diabetes mellitus (DM) 03/22/2023   Anxiety 05/23/2022   Attention deficit hyperactivity disorder (ADHD), predominantly inattentive type 05/23/2022   Ulcerative pancolitis with rectal bleeding (HCC) 08/23/2021   Vitamin B 12 deficiency 08/23/2021   Psoriasis 08/23/2021   Status post repeat low transverse cesarean section 09/23/2016   Asthma 02/14/2015   Allergic rhinoconjunctivitis 02/14/2015   Food allergy 02/14/2015   Laryngopharyngeal reflux (LPR) 02/14/2015   Vomiting 02/14/2015   Family history of malignant neoplasm of breast 11/11/2013   Family history of malignant neoplasm of gastrointestinal tract 11/11/2013   Family history of thyroid cancer 11/11/2013   Penicillin allergy 01/21/2013   Shellfish allergy 01/21/2013   Low-lying  placenta 01/21/2013    Allergies:  Allergies  Allergen Reactions   Ciprofloxacin     Other reaction(s): Dizziness, severe dizziness   Iodine Anaphylaxis    Other reaction(s): anaphylaxis   Oseltamivir      Other reaction(s): Other (See Comments) Bloody diarrhea Other reaction(s): bloody diarrhea    Penicillins Hives, Shortness Of Breath, Other (See Comments) and Anaphylaxis    Has patient had a PCN reaction causing immediate rash, facial/tongue/throat swelling, SOB or lightheadedness with hypotension: Yes Has patient had a PCN reaction causing severe rash involving mucus membranes or skin necrosis: No Has patient had a PCN reaction that required hospitalization No Has patient had a PCN reaction occurring within the last 10 years: YES If all of the above answers are NO, then may proceed with Cephalosporin use.  Has patient had a PCN reaction causing immediate rash, facial/tongue/throat swelling, SOB or lightheadedness with hypotension: Yes  Other reaction(s): cardiac arrest/anaphlaxis   Shellfish Allergy Anaphylaxis   Shellfish-Derived Products Anaphylaxis    Other reaction(s): cardiac arrest/anaphlaxis Cardiac Arrest   Adalimumab Other (See Comments)    Other reaction(s): caused psoriasis, Dizziness   Bactroban [Mupirocin Calcium ] Hives   Metronidazole Other (See Comments)    Other reaction(s): Dizziness, severe dizziness   Egg-Derived Products Nausea And Vomiting    Other reaction(s): vomiting   Mupirocin Hives and Rash    Other reaction(s): unknown   Medications:  Current Outpatient Medications:    doxycycline (VIBRA-TABS) 100 MG tablet, Take 1 tablet (100 mg total) by mouth 2 (two) times daily., Disp: 14 tablet, Rfl: 0   predniSONE  (DELTASONE ) 20 MG tablet, Take 2 tablets (40 mg total) by mouth daily with breakfast., Disp: 10 tablet, Rfl: 0   promethazine -dextromethorphan (PROMETHAZINE -DM) 6.25-15 MG/5ML syrup, Take 5 mLs by mouth 4 (four) times daily as needed for cough., Disp: 118 mL, Rfl: 0   albuterol  (VENTOLIN  HFA) 108 (90 Base) MCG/ACT inhaler, INHALE 2 PUFFS INTO THE LUNGS EVERY 4 HOURS AS NEEDED FOR WHEEZING OR SHORTNESS OF BREATH., Disp: 6.7 each, Rfl: 0   amphetamine -dextroamphetamine  (ADDERALL) 10 MG tablet, Take 1 tablet (10 mg total) by mouth daily with  breakfast., Disp: 30 tablet, Rfl: 0   [START ON 03/10/2024] amphetamine -dextroamphetamine  (ADDERALL) 10 MG tablet, Take 1 tablet (10 mg total) by mouth daily with breakfast., Disp: 30 tablet, Rfl: 0   [START ON 04/09/2024] amphetamine -dextroamphetamine  (ADDERALL) 10 MG tablet, Take 1 tablet (10 mg total) by mouth daily with breakfast., Disp: 30 tablet, Rfl: 0   busPIRone  (BUSPAR ) 5 MG tablet, Take 1 tablet (5 mg total) by mouth 2 (two) times daily., Disp: 180 tablet, Rfl: 1   cyanocobalamin  (VITAMIN B12) 1000 MCG/ML injection, Inject 1 mL (1,000 mcg total) into the muscle every 30 (thirty) days., Disp: 3 mL, Rfl: 3   EPINEPHrine  0.3 mg/0.3 mL IJ SOAJ injection, Inject 0.3 mg into the muscle once as needed (for severe allergic reaction). , Disp: , Rfl:    Ferrous Sulfate (IRON  PO), Take by mouth., Disp: , Rfl:    loratadine  (CLARITIN ) 10 MG tablet, Take 10 mg by mouth daily as needed for allergies., Disp: , Rfl:    Multiple Vitamin (MULTI VITAMIN DAILY PO), Take by mouth., Disp: , Rfl:    SYRINGE-NEEDLE, DISP, 3 ML (LUER LOCK SAFETY SYRINGES) 25G X 1 3 ML MISC, 1 each by Does not apply route once a week., Disp: 10 each, Rfl: 3   ustekinumab  (STELARA ) 90 MG/ML SOSY injection, Inject 1 mL (90 mg total) into the skin  every 8 (eight) weeks., Disp: 1 mL, Rfl: 5   Vitamin D , Ergocalciferol , (DRISDOL ) 1.25 MG (50000 UNIT) CAPS capsule, Take 1 capsule (50,000 Units total) by mouth every 7 (seven) days., Disp: 5 capsule, Rfl: 3  Observations/Objective: Patient is well-developed, well-nourished in no acute distress.  Resting comfortably at home.  Head is normocephalic, atraumatic.  No labored breathing. No audible wheezing at time of video examination.  Speech is clear and coherent with logical content.  Patient is alert and oriented at baseline.    Assessment and Plan: 1. Acute bacterial bronchitis (Primary) - promethazine -dextromethorphan (PROMETHAZINE -DM) 6.25-15 MG/5ML syrup; Take 5 mLs by mouth 4  (four) times daily as needed for cough.  Dispense: 118 mL; Refill: 0 - doxycycline (VIBRA-TABS) 100 MG tablet; Take 1 tablet (100 mg total) by mouth 2 (two) times daily.  Dispense: 14 tablet; Refill: 0  2. Mild intermittent asthma with exacerbation - predniSONE  (DELTASONE ) 20 MG tablet; Take 2 tablets (40 mg total) by mouth daily with breakfast.  Dispense: 10 tablet; Refill: 0  Rx Doxycycline.  Increase fluids.  Rest.  Saline nasal spray.  Probiotic.  Mucinex as directed.  Humidifier in bedroom. Albuterol  refilled. Promethazine -DM and prednisone  per orders.  In-person evaluation ASAP for any non-resolving, new or worsening symptoms despite treatment.   Follow Up Instructions: I discussed the assessment and treatment plan with the patient. The patient was provided an opportunity to ask questions and all were answered. The patient agreed with the plan and demonstrated an understanding of the instructions.  A copy of instructions were sent to the patient via MyChart unless otherwise noted below.   The patient was advised to call back or seek an in-person evaluation if the symptoms worsen or if the condition fails to improve as anticipated.    Natalie Velma Lunger, [name]

## 2024-03-10 ENCOUNTER — Encounter: Payer: Self-pay | Admitting: Internal Medicine

## 2024-03-11 ENCOUNTER — Other Ambulatory Visit (HOSPITAL_COMMUNITY): Payer: Self-pay

## 2024-03-11 ENCOUNTER — Telehealth: Payer: Self-pay

## 2024-03-11 NOTE — Telephone Encounter (Signed)
 PA request has been Submitted. New Encounter has been or will be created for follow up. For additional info see Pharmacy Prior Auth telephone encounter from 03-11-2024.

## 2024-03-11 NOTE — Telephone Encounter (Signed)
 Pharmacy Patient Advocate Encounter   Received notification from Patient Advice Request messages that prior authorization for Stelara  90MG /ML syringes is required/requested.   Insurance verification completed.   The patient is insured through [facility].   Per test claim: PA required; PA submitted to above mentioned insurance via Latent Key/confirmation #/EOC [id number] Status is pending

## 2024-03-11 NOTE — Telephone Encounter (Signed)
 Pharmacy Patient Advocate Encounter  Received notification from [employer] that Prior Authorization for Stelara  90MG /ML syringes has been CANCELLED due to Stelara  is no longer covered under pharmacy benefits and is a plan exclusion.    PA #/Case ID/Reference #: [id number]

## 2024-03-14 ENCOUNTER — Telehealth: Payer: Self-pay

## 2024-03-14 ENCOUNTER — Other Ambulatory Visit (HOSPITAL_COMMUNITY): Payer: Self-pay

## 2024-03-14 MED ORDER — WEZLANA 90 MG/ML ~~LOC~~ SOSY: 90.0000 mg | PREFILLED_SYRINGE | SUBCUTANEOUS | 5 refills | Status: DC

## 2024-03-14 NOTE — Telephone Encounter (Signed)
 Wezlana 90 mg every 8 weeks has been sent to [facility]. Please start PA process. Thanks

## 2024-03-14 NOTE — Telephone Encounter (Signed)
 Pharmacy Patient Advocate Encounter   Received notification from Pt Calls Messages that prior authorization for Wezlana 90MG /ML syringes is required/requested.   Insurance verification completed.   The patient is insured through [facility]   Per test claim: PA required; PA submitted to above mentioned insurance via Fax Key/confirmation #/EOC [id number] Status is pending

## 2024-03-14 NOTE — Telephone Encounter (Signed)
 Both of the alternatives listed, Wezlana and Hansel, are preferred by insurance. Both will require a prior authorization. I need to know which medication to start a request for.

## 2024-03-18 ENCOUNTER — Other Ambulatory Visit (HOSPITAL_COMMUNITY): Payer: Self-pay

## 2024-03-18 NOTE — Telephone Encounter (Signed)
 Still shows as pending but I will give the insurance a call.

## 2024-03-22 ENCOUNTER — Other Ambulatory Visit (HOSPITAL_COMMUNITY): Payer: Self-pay

## 2024-03-22 MED ORDER — WEZLANA 90 MG/ML ~~LOC~~ SOSY: 90.0000 mg | PREFILLED_SYRINGE | SUBCUTANEOUS | 5 refills | Status: AC

## 2024-03-22 NOTE — Telephone Encounter (Signed)
 Pharmacy Patient Advocate Encounter  Received notification from [employer] that Prior Authorization for Wezlana 90MG /ML syringes has been APPROVED from 02-01-2024 to 09-27-2024.  Must fill at [facility].  PA #/Case ID/Reference #: [id number]

## 2024-03-22 NOTE — Telephone Encounter (Signed)
 RX sent to [facility] specialty pharmacy, patient notified via MyChart.

## 2024-04-25 ENCOUNTER — Encounter: Payer: Self-pay | Admitting: Internal Medicine

## 2024-04-25 DIAGNOSIS — K5150 Left sided colitis without complications: Secondary | ICD-10-CM

## 2024-04-26 NOTE — Telephone Encounter (Signed)
 Per 03-11-2024 encounter, the Stelara  is a plan exclusion and no longer on the patient's formulary. Meaning it will not be covered, not by medical decision, but by policy/formulary exclusion.

## 2024-04-27 NOTE — Telephone Encounter (Signed)
 Lab and stool study orders entered in epic.  Diatherix kit placed at 2nd floor front desk.

## 2024-05-13 ENCOUNTER — Other Ambulatory Visit: Payer: Self-pay | Admitting: Internal Medicine

## 2024-05-13 DIAGNOSIS — E559 Vitamin D deficiency, unspecified: Secondary | ICD-10-CM
